# Patient Record
Sex: Female | Born: 1980 | Race: White | Hispanic: No | Marital: Married | Smoking: Former smoker
Health system: Southern US, Community
[De-identification: ages and names within clinical notes are randomized; demographics above are authoritative.]

## PROBLEM LIST (undated history)

## (undated) DIAGNOSIS — N200 Calculus of kidney: Secondary | ICD-10-CM

## (undated) DIAGNOSIS — F32A Depression, unspecified: Secondary | ICD-10-CM

## (undated) DIAGNOSIS — R55 Syncope and collapse: Secondary | ICD-10-CM

## (undated) DIAGNOSIS — G43909 Migraine, unspecified, not intractable, without status migrainosus: Secondary | ICD-10-CM

## (undated) DIAGNOSIS — I10 Essential (primary) hypertension: Secondary | ICD-10-CM

## (undated) HISTORY — PX: TUBAL LIGATION: SHX77

## (undated) HISTORY — DX: Essential (primary) hypertension: I10

## (undated) HISTORY — DX: Syncope and collapse: R55

## (undated) HISTORY — PX: TYMPANOSTOMY TUBE PLACEMENT: SHX32

---

## 2003-07-16 ENCOUNTER — Inpatient Hospital Stay (HOSPITAL_COMMUNITY): Admission: AD | Admit: 2003-07-16 | Discharge: 2003-07-19 | Payer: Self-pay | Admitting: *Deleted

## 2003-07-25 ENCOUNTER — Encounter: Admission: RE | Admit: 2003-07-25 | Discharge: 2003-07-25 | Payer: Self-pay | Admitting: *Deleted

## 2003-08-01 ENCOUNTER — Encounter: Admission: RE | Admit: 2003-08-01 | Discharge: 2003-08-01 | Payer: Self-pay | Admitting: *Deleted

## 2003-08-07 ENCOUNTER — Inpatient Hospital Stay (HOSPITAL_COMMUNITY): Admission: AD | Admit: 2003-08-07 | Discharge: 2003-08-08 | Payer: Self-pay | Admitting: Obstetrics & Gynecology

## 2003-08-15 ENCOUNTER — Encounter: Admission: RE | Admit: 2003-08-15 | Discharge: 2003-08-15 | Payer: Self-pay | Admitting: *Deleted

## 2003-08-22 ENCOUNTER — Encounter: Admission: RE | Admit: 2003-08-22 | Discharge: 2003-08-22 | Payer: Self-pay | Admitting: Family Medicine

## 2003-08-29 ENCOUNTER — Encounter: Admission: RE | Admit: 2003-08-29 | Discharge: 2003-08-29 | Payer: Self-pay | Admitting: Family Medicine

## 2003-09-05 ENCOUNTER — Encounter: Admission: RE | Admit: 2003-09-05 | Discharge: 2003-09-05 | Payer: Self-pay | Admitting: Family Medicine

## 2003-09-07 ENCOUNTER — Observation Stay (HOSPITAL_COMMUNITY): Admission: AD | Admit: 2003-09-07 | Discharge: 2003-09-08 | Payer: Self-pay | Admitting: Obstetrics & Gynecology

## 2003-09-12 ENCOUNTER — Inpatient Hospital Stay (HOSPITAL_COMMUNITY): Admission: AD | Admit: 2003-09-12 | Discharge: 2003-09-12 | Payer: Self-pay | Admitting: Obstetrics & Gynecology

## 2003-09-13 ENCOUNTER — Inpatient Hospital Stay (HOSPITAL_COMMUNITY): Admission: AD | Admit: 2003-09-13 | Discharge: 2003-09-14 | Payer: Self-pay | Admitting: Family Medicine

## 2004-01-23 ENCOUNTER — Emergency Department: Payer: Self-pay | Admitting: Emergency Medicine

## 2004-08-13 ENCOUNTER — Observation Stay: Payer: Self-pay | Admitting: Obstetrics and Gynecology

## 2004-08-17 ENCOUNTER — Ambulatory Visit: Payer: Self-pay | Admitting: Obstetrics and Gynecology

## 2004-10-18 ENCOUNTER — Inpatient Hospital Stay: Payer: Self-pay | Admitting: Obstetrics and Gynecology

## 2006-02-18 ENCOUNTER — Emergency Department: Payer: Self-pay | Admitting: Emergency Medicine

## 2006-04-23 ENCOUNTER — Emergency Department: Payer: Self-pay | Admitting: Emergency Medicine

## 2006-08-29 ENCOUNTER — Emergency Department: Payer: Self-pay | Admitting: Emergency Medicine

## 2007-04-14 ENCOUNTER — Emergency Department: Payer: Self-pay | Admitting: Unknown Physician Specialty

## 2007-08-20 ENCOUNTER — Emergency Department: Payer: Self-pay | Admitting: Emergency Medicine

## 2008-02-10 ENCOUNTER — Emergency Department: Payer: Self-pay | Admitting: Emergency Medicine

## 2008-02-13 ENCOUNTER — Emergency Department (HOSPITAL_COMMUNITY): Admission: EM | Admit: 2008-02-13 | Discharge: 2008-02-13 | Payer: Self-pay | Admitting: Emergency Medicine

## 2008-02-21 ENCOUNTER — Inpatient Hospital Stay (HOSPITAL_COMMUNITY): Admission: AD | Admit: 2008-02-21 | Discharge: 2008-02-21 | Payer: Self-pay | Admitting: Obstetrics & Gynecology

## 2008-03-22 ENCOUNTER — Emergency Department: Payer: Self-pay | Admitting: Emergency Medicine

## 2008-08-26 ENCOUNTER — Ambulatory Visit: Payer: Self-pay | Admitting: Family Medicine

## 2008-11-04 ENCOUNTER — Encounter: Admission: RE | Admit: 2008-11-04 | Discharge: 2008-11-04 | Payer: Self-pay | Admitting: Neurology

## 2010-02-08 ENCOUNTER — Ambulatory Visit: Payer: Self-pay | Admitting: Internal Medicine

## 2010-03-14 ENCOUNTER — Emergency Department: Payer: Self-pay | Admitting: Emergency Medicine

## 2010-08-19 ENCOUNTER — Emergency Department: Payer: Self-pay | Admitting: Internal Medicine

## 2010-09-04 NOTE — Discharge Summary (Signed)
NAME:  Nicole Rowe, Nicole Rowe                         ACCOUNT NO.:  [id number]   MEDICAL RECORD NO.:  [id number]                   PATIENT TYPE:  INP   LOCATION:  9158                                 FACILITY:  [facility]   PHYSICIAN:  Franchot Mimes, MD                   DATE OF BIRTH:  Nov 28, 1980   DATE OF ADMISSION:  07/16/2003  DATE OF DISCHARGE:  07/19/2003                                 DISCHARGE SUMMARY   DISCHARGE DIAGNOSES:  1. Viable intrauterine pregnancy.  2. Threatened preterm labor.  3. Status post betamethasone times two.   DISCHARGE MEDICATIONS:  1. Procardia XL 30 mg p.o. b.i.d.  2. Clindamycin 300 mg p.o. q.i.d. times five days.  3. Prenatal vitamin every day.   HISTORY OF PRESENT ILLNESS:  The patient is a 30 year old, G2, P0-1-0-0, who  presented at 36 weeks 0 days gestational age as a transfer from [facility] for concern of preterm labor. The patient states  that she had continued cramping and vaginal spotting for approximately one  day prior to presentation to [facility]. Cervical check performed at  that hospital revealed the cervix to be fingertip, 50% effaced, with a high  station. On the monitor the patient was contracting approximately every two  minutes for about 30 to 40 seconds and indicated they were mildly painful.  The patient was initially treated with terbutaline at the outside hospital  and then with magnesium sulfate with low results. She was then transferred  to the [facility] [employer] as a precautionary measure in case of  delivery and the need of a level 3 nursery.   HOSPITAL COURSE:   PROBLEM #1:  Threatened preterm labor. On presentation the patient was  started on gentamycin and clindamycin as well as magnesium sulfate at 3 gm  per hour. The day following admission the patient continued to have  contractions approximately every four to five minutes. However, the patient  was no longer feeling these  contractions. The patient had stable blood  pressures and no neurologic symptoms of preeclampsia throughout the entire  hospital course. She was given betamethasone times two 24 hours apart upon  presentation to [facility]. Two days following admission the patient's  magnesium was weaned by 0.5 gm q.2h. until it was discontinued. Once the  magnesium was discontinued the patient was started on Procardia 10 mg p.o.  t.i.d. Tocometer at the time of discharge revealed no contractions for  approximately the last 12 hours prior to discharge.   Ultrasound obtained on admission revealed appropriate growth a normal AFI of  14.8. However, the cervix revealed funneling of the internal os with a  cervical length of 1.2 cm. However, since the patient was no longer  contracting while off magnesium and on Procardia, decision was made that the  patient was okay for discharge home with bedrest, Procardia, and  antibiotics.  DISCHARGE DESTINATION:  The patient was discharged home to bedrest.   FOLLOWUP APPOINTMENT:  The patient was scheduled for follow up in the high  risk clinic on July 25, 2003, at 10:45 a.m.   LABORATORY DATA:  Chlamydia, Gonorrhea, or GBS cultures were performed at an  outside hospital and the results are not available at the time of dictation.  A urine culture was drawn while in the hospital which revealed no growth.   SUGGESTED FOLLOWUP ITEMS:  A follow-up ultrasound and routine cervical  checks recommended in the future to monitor cervical length, cervical  dilatation and effacement.                                               Franchot Mimes, MD    TV/MEDQ  D:  08/11/2003  T:  08/11/2003  Job:  [id number]

## 2011-01-19 LAB — WET PREP, GENITAL
Trich, Wet Prep: NONE SEEN
Yeast Wet Prep HPF POC: NONE SEEN

## 2011-01-19 LAB — POCT URINALYSIS DIP (DEVICE)
Glucose, UA: NEGATIVE
Hgb urine dipstick: NEGATIVE
Nitrite: NEGATIVE
Protein, ur: NEGATIVE
Specific Gravity, Urine: 1.015
Urobilinogen, UA: 1
pH: 7

## 2011-01-19 LAB — CBC
HCT: 40.7
MCHC: 32.6
MCV: 86.7
Platelets: 315
WBC: 8.2

## 2011-01-19 LAB — HCG, QUANTITATIVE, PREGNANCY: hCG, Beta Chain, Quant, S: <2

## 2011-03-02 ENCOUNTER — Emergency Department: Payer: Self-pay | Admitting: Emergency Medicine

## 2011-05-12 ENCOUNTER — Ambulatory Visit: Payer: Self-pay | Admitting: Specialist

## 2011-08-05 ENCOUNTER — Emergency Department: Payer: Self-pay | Admitting: Emergency Medicine

## 2011-09-12 ENCOUNTER — Emergency Department: Payer: Self-pay | Admitting: Emergency Medicine

## 2011-10-04 ENCOUNTER — Ambulatory Visit: Payer: Self-pay | Admitting: Emergency Medicine

## 2012-02-05 ENCOUNTER — Emergency Department: Payer: Self-pay | Admitting: Emergency Medicine

## 2012-02-21 ENCOUNTER — Emergency Department: Payer: Self-pay | Admitting: Emergency Medicine

## 2013-02-07 ENCOUNTER — Emergency Department: Payer: Self-pay | Admitting: Emergency Medicine

## 2013-03-11 ENCOUNTER — Emergency Department: Payer: Self-pay | Admitting: Emergency Medicine

## 2014-01-03 ENCOUNTER — Emergency Department: Payer: Self-pay | Admitting: Emergency Medicine

## 2014-01-03 LAB — TROPONIN I

## 2014-01-03 LAB — BASIC METABOLIC PANEL
Anion Gap: 8 (ref 7–16)
BUN: 12 mg/dL (ref 7–18)
Calcium, Total: 8.8 mg/dL (ref 8.5–10.1)
Chloride: 110 mmol/L — ABNORMAL HIGH (ref 98–107)
Co2: 24 mmol/L (ref 21–32)
Creatinine: 0.63 mg/dL (ref 0.60–1.30)
EGFR (African American): >60
GLUCOSE: 101 mg/dL — AB (ref 65–99)
OSMOLALITY: 283 (ref 275–301)
POTASSIUM: 3.7 mmol/L (ref 3.5–5.1)
Sodium: 142 mmol/L (ref 136–145)

## 2014-01-03 LAB — CBC
HCT: 39.2 % (ref 35.0–47.0)
HGB: 12.6 g/dL (ref 12.0–16.0)
MCH: 27.7 pg (ref 26.0–34.0)
MCHC: 32 g/dL (ref 32.0–36.0)
MCV: 86 fL (ref 80–100)
PLATELETS: 268 10*3/uL (ref 150–440)
RBC: 4.54 10*6/uL (ref 3.80–5.20)
RDW: 13.8 % (ref 11.5–14.5)
WBC: 9.9 10*3/uL (ref 3.6–11.0)

## 2014-01-03 LAB — PRO B NATRIURETIC PEPTIDE: B-Type Natriuretic Peptide: 30 pg/mL (ref 0–125)

## 2014-01-08 ENCOUNTER — Encounter: Payer: Self-pay | Admitting: Cardiovascular Disease

## 2014-01-08 ENCOUNTER — Ambulatory Visit (INDEPENDENT_AMBULATORY_CARE_PROVIDER_SITE_OTHER): Payer: Medicaid Other | Admitting: Cardiovascular Disease

## 2014-01-08 VITALS — BP 133/92 | HR 69 | Ht 63.0 in | Wt 192.5 lb

## 2014-01-08 DIAGNOSIS — R079 Chest pain, unspecified: Secondary | ICD-10-CM

## 2014-01-08 DIAGNOSIS — R0602 Shortness of breath: Secondary | ICD-10-CM

## 2014-01-08 NOTE — Patient Instructions (Signed)
Your physician has requested that you have an exercise tolerance test. For further information please visit [email or web address] Please also follow instruction sheet, as given. - Hold Atenolol the morning of your exercise tolerance test  - Eat a light meal  - Wear comfortable cloths and walking shoes    Your physician has requested that you have an echocardiogram. Echocardiography is a painless test that uses sound waves to create images of your heart. It provides your doctor with information about the size and shape of your heart and how well your heart's chambers and valves are working. This procedure takes approximately one hour. There are no restrictions for this procedure.   Your physician recommends that you schedule a follow-up appointment in:  As needed

## 2014-01-11 ENCOUNTER — Encounter: Payer: Self-pay | Admitting: Cardiovascular Disease

## 2014-01-11 DIAGNOSIS — R079 Chest pain, unspecified: Secondary | ICD-10-CM | POA: Insufficient documentation

## 2014-01-11 NOTE — Assessment & Plan Note (Signed)
The chest pain is overall atypical but she does have risk factors for coronary artery disease. Thus, I requested a treadmill stress test for evaluation. I discussed with the patient the importance of lifestyle changes in order to decrease the chance of future coronary artery disease and cardiovascular events. We discussed the importance of controlling risk factors, healthy diet as well as regular exercise. I also explained to him that a normal stress test does not rule out atherosclerosis.

## 2014-01-11 NOTE — Progress Notes (Signed)
   HPI  This is a 33 year old female who was referred from the emergency room at [facility] for evaluation of chest pain. She has known history of hypertension and migraines. She has no history of diabetes, hyperlipidemia or tobacco use. She does have family history of premature coronary artery disease. She had intermittent substernal sharp chest pain since last week lasting for a few minutes with mild shortness of breath. This has been mostly nonexertional. She endorses exertional dyspnea with no orthopnea or PND. Workup in the emergency room showed normal EKG. Left foot unremarkable including negative cardiac enzymes. BNP was normal.  Allergies  Allergen Reactions  . Penicillins     BRUISING AND SWELLING     No current outpatient prescriptions on file prior to visit.   No current facility-administered medications on file prior to visit.     Past Medical History  Diagnosis Date  . Syncope and collapse   . Hypertension      Past Surgical History  Procedure Laterality Date  . Tympanostomy tube placement    . Tubal ligation       Family History  Problem Relation Age of Onset  . Arrhythmia Mother   . Hyperlipidemia Mother   . Heart attack Father   . Stroke Father   . Hypertension Father   . Hyperlipidemia Father      History   Social History  . Marital Status: Single    Spouse Name: N/A    Number of Children: N/A  . Years of Education: N/A   Occupational History  . Not on file.   Social History Main Topics  . Smoking status: Never Smoker   . Smokeless tobacco: Not on file  . Alcohol Use: No  . Drug Use: No  . Sexual Activity: Not on file   Other Topics Concern  . Not on file   Social History Narrative  . No narrative on file     ROS A 10 point review of system was performed. It is negative other than that mentioned in the history of present illness.   PHYSICAL EXAM   BP 133/92  Pulse 69  Ht  (1.6 m)  Wt 192 lb 8 oz (87.317 kg)  BMI 34.11  kg/m2 Constitutional: She is oriented to person, place, and time. She appears well-developed and well-nourished. No distress.  HENT: No nasal discharge.  Head: Normocephalic and atraumatic.  Eyes: Pupils are equal and round. No discharge.  Neck: Normal range of motion. Neck supple. No JVD present. No thyromegaly present.  Cardiovascular: Normal rate, regular rhythm, normal heart sounds. Exam reveals no gallop and no friction rub. No murmur heard.  Pulmonary/Chest: Effort normal and breath sounds normal. No stridor. No respiratory distress. She has no wheezes. She has no rales. She exhibits no tenderness.  Abdominal: Soft. Bowel sounds are normal. She exhibits no distension. There is no tenderness. There is no rebound and no guarding.  Musculoskeletal: Normal range of motion. She exhibits no edema and no tenderness.  Neurological: She is alert and oriented to person, place, and time. Coordination normal.  Skin: Skin is warm and dry. No rash noted. She is not diaphoretic. No erythema. No pallor.  Psychiatric: She has a normal mood and affect. Her behavior is normal. Judgment and thought content normal.     ZOX:WRUEA  Rhythm  WITHIN NORMAL LIMITS   ASSESSMENT AND PLAN

## 2014-01-18 ENCOUNTER — Other Ambulatory Visit (INDEPENDENT_AMBULATORY_CARE_PROVIDER_SITE_OTHER): Payer: Medicaid Other

## 2014-01-18 ENCOUNTER — Ambulatory Visit (INDEPENDENT_AMBULATORY_CARE_PROVIDER_SITE_OTHER): Payer: Medicaid Other | Admitting: Cardiovascular Disease

## 2014-01-18 ENCOUNTER — Other Ambulatory Visit: Payer: Self-pay

## 2014-01-18 ENCOUNTER — Encounter: Payer: Medicaid Other | Admitting: Cardiovascular Disease

## 2014-01-18 VITALS — BP 111/74 | HR 73 | Ht 63.0 in | Wt 192.0 lb

## 2014-01-18 DIAGNOSIS — R0602 Shortness of breath: Secondary | ICD-10-CM

## 2014-01-18 DIAGNOSIS — R079 Chest pain, unspecified: Secondary | ICD-10-CM

## 2014-01-18 NOTE — Patient Instructions (Addendum)
You had  normal stress test today.  Target heart rate achieved Good exercise tolerance No EKG changes concerning for ischemia Please call if you have any further questions

## 2014-01-18 NOTE — Procedures (Signed)
Exercise Treadmill Test  Treadmill ordered for recent epsiodes of chest pain.  Resting EKG shows NSR with rate of 73 bpm, no specific ST or T wave changes Resting blood pressure of 111/74. Stand bruce protocal was used.  Patient exercised for 9 min 00 sec,  Peak heart rate of 196 bpm.  This was 89% of the maximum predicted heart rate (188 bpm). Achieved 10.1 METS No symptoms of chest pain or lightheadedness were reported at peak stress or in recovery.  Peak Blood pressure recorded was 139/82. Heart rate at 3 minutes in recovery was 88 bpm.  FINAL IMPRESSION: Normal exercise stress test. No significant EKG changes concerning for ischemia. Excellent exercise tolerance.

## 2014-01-21 ENCOUNTER — Other Ambulatory Visit: Payer: Self-pay | Admitting: *Deleted

## 2014-01-21 NOTE — Progress Notes (Signed)
   Patient ID: Nicole Rowe, female    DOB: 1980/11/04, 33 y.o.   MRN: [id number]  HPI Comments: Treadmill stress test ordered by Dr. Kirke CorinArida for further evaluation of chest pain     Review of Systems    Physical Exam

## 2014-01-21 NOTE — Assessment & Plan Note (Signed)
Treadmill stress test ordered today for evaluation of chest pain

## 2014-01-24 ENCOUNTER — Ambulatory Visit: Payer: Self-pay | Admitting: Primary Care

## 2014-05-31 ENCOUNTER — Emergency Department: Payer: Self-pay | Admitting: Emergency Medicine

## 2014-07-12 ENCOUNTER — Ambulatory Visit: Payer: Self-pay | Admitting: Specialist

## 2014-07-16 ENCOUNTER — Emergency Department
Admit: 2014-07-16 | Disposition: A | Discharge status: Home / Self Care | Payer: Self-pay | Admitting: Emergency Medicine

## 2014-10-03 ENCOUNTER — Emergency Department: Payer: Medicaid Other

## 2014-10-03 ENCOUNTER — Encounter: Payer: Self-pay | Admitting: Emergency Medicine

## 2014-10-03 ENCOUNTER — Emergency Department
Admission: EM | Admit: 2014-10-03 | Discharge: 2014-10-03 | Disposition: A | Discharge status: Home / Self Care | Payer: Medicaid Other | Attending: Emergency Medicine | Admitting: Emergency Medicine

## 2014-10-03 DIAGNOSIS — X58XXXA Exposure to other specified factors, initial encounter: Secondary | ICD-10-CM | POA: Insufficient documentation

## 2014-10-03 DIAGNOSIS — Y9289 Other specified places as the place of occurrence of the external cause: Secondary | ICD-10-CM | POA: Insufficient documentation

## 2014-10-03 DIAGNOSIS — Y998 Other external cause status: Secondary | ICD-10-CM | POA: Insufficient documentation

## 2014-10-03 DIAGNOSIS — Y9301 Activity, walking, marching and hiking: Secondary | ICD-10-CM | POA: Insufficient documentation

## 2014-10-03 DIAGNOSIS — S93402A Sprain of unspecified ligament of left ankle, initial encounter: Secondary | ICD-10-CM | POA: Insufficient documentation

## 2014-10-03 DIAGNOSIS — I10 Essential (primary) hypertension: Secondary | ICD-10-CM | POA: Insufficient documentation

## 2014-10-03 MED ORDER — TRAMADOL HCL 50 MG PO TABS: 50.0000 mg | ORAL_TABLET | Freq: Two times a day (BID) | ORAL | Status: DC

## 2014-10-03 MED ORDER — KETOROLAC TROMETHAMINE 10 MG PO TABS: 10.0000 mg | ORAL_TABLET | Freq: Three times a day (TID) | ORAL | Status: DC

## 2014-10-03 NOTE — Discharge Instructions (Signed)
Ankle Sprain °An ankle sprain is an injury to the strong, fibrous tissues (ligaments) that hold the bones of your ankle joint together.  °CAUSES °An ankle sprain is usually caused by a fall or by twisting your ankle. Ankle sprains most commonly occur when you step on the outer edge of your foot, and your ankle turns inward. People who participate in sports are more prone to these types of injuries.  °SYMPTOMS  °· Pain in your ankle. The pain may be present at rest or only when you are trying to stand or walk. °· Swelling. °· Bruising. Bruising may develop immediately or within 1 to 2 days after your injury. °· Difficulty standing or walking, particularly when turning corners or changing directions. °DIAGNOSIS  °Your caregiver will ask you details about your injury and perform a physical exam of your ankle to determine if you have an ankle sprain. During the physical exam, your caregiver will press on and apply pressure to specific areas of your foot and ankle. Your caregiver will try to move your ankle in certain ways. An X-ray exam may be done to be sure a bone was not broken or a ligament did not separate from one of the bones in your ankle (avulsion fracture).  °TREATMENT  °Certain types of braces can help stabilize your ankle. Your caregiver can make a recommendation for this. Your caregiver may recommend the use of medicine for pain. If your sprain is severe, your caregiver may refer you to a surgeon who helps to restore function to parts of your skeletal system (orthopedist) or a physical therapist. °HOME CARE INSTRUCTIONS  °· Apply ice to your injury for 1-2 days or as directed by your caregiver. Applying ice helps to reduce inflammation and pain. °¨ Put ice in a plastic bag. °¨ Place a towel between your skin and the bag. °¨ Leave the ice on for 15-20 minutes at a time, every 2 hours while you are awake. °· Only take over-the-counter or prescription medicines for pain, discomfort, or fever as directed by  your caregiver. °· Elevate your injured ankle above the level of your heart as much as possible for 2-3 days. °· If your caregiver recommends crutches, use them as instructed. Gradually put weight on the affected ankle. Continue to use crutches or a cane until you can walk without feeling pain in your ankle. °· If you have a plaster splint, wear the splint as directed by your caregiver. Do not rest it on anything harder than a pillow for the first 24 hours. Do not put weight on it. Do not get it wet. You may take it off to take a shower or bath. °· You may have been given an elastic bandage to wear around your ankle to provide support. If the elastic bandage is too tight (you have numbness or tingling in your foot or your foot becomes cold and blue), adjust the bandage to make it comfortable. °· If you have an air splint, you may blow more air into it or let air out to make it more comfortable. You may take your splint off at night and before taking a shower or bath. Wiggle your toes in the splint several times per day to decrease swelling. °SEEK MEDICAL CARE IF:  °· You have rapidly increasing bruising or swelling. °· Your toes feel extremely cold or you lose feeling in your foot. °· Your pain is not relieved with medicine. °SEEK IMMEDIATE MEDICAL CARE IF: °· Your toes are numb or blue. °·   You have severe pain that is increasing. MAKE SURE YOU:   Understand these instructions.  Will watch your condition.  Will get help right away if you are not doing well or get worse. Document Released: 04/05/2005 Document Revised: 12/29/2011 Document Reviewed: 04/17/2011 [facility] Patient Information 2015 [place], [place]. This information is not intended to replace advice given to you by your health care provider. Make sure you discuss any questions you have with your health care provider.  Wear the stirrup splint as needed for support.  Apply ice to reduce swelling.  Follow-up with your provider or Dr. Ernest Pine for  ongoing care & management.

## 2014-10-03 NOTE — ED Notes (Signed)
Pt states that she was walking down some steps, thought that she was at the last step and rolled her left ankle. Swelling is seen at this time. Pt is able to ambulate and bear weight at this time.

## 2014-10-03 NOTE — ED Provider Notes (Signed)
[facility] Emergency Department Provider Note ____________________________________________  Time seen: 1302  I have reviewed the triage vital signs and the nursing notes.  HISTORY  Chief Complaint  Ankle Pain  HPI Nicole Rowe is a 34 y.o. female reports to the ED with left ankle pain after she twisted it while walking down some steps yesterday.She notes some swelling to the lateral ankle at this time. She denies ankle catch, click, lock, or giveway.   Past Medical History  Diagnosis Date  . Syncope and collapse   . Hypertension     Patient Active Problem List   Diagnosis Date Noted  . Pain in the chest 01/11/2014    Past Surgical History  Procedure Laterality Date  . Tympanostomy tube placement    . Tubal ligation      Current Outpatient Rx  Name  Route  Sig  Dispense  Refill  . atenolol-chlorthalidone (TENORETIC) 100-25 MG per tablet   Oral   Take 1 tablet by mouth as needed.         Marland Kitchen ketorolac (TORADOL) 10 MG tablet   Oral   Take 1 tablet (10 mg total) by mouth every 8 (eight) hours.   15 tablet   0   . promethazine (PHENERGAN) 25 MG tablet   Oral   Take 25 mg by mouth daily.         . QUEtiapine (SEROQUEL) 25 MG tablet   Oral   Take 25 mg by mouth at bedtime.         Marland Kitchen tiZANidine (ZANAFLEX) 2 MG tablet   Oral   Take by mouth 3 (three) times daily as needed for muscle spasms.         . traMADol (ULTRAM) 50 MG tablet   Oral   Take 1 tablet (50 mg total) by mouth 2 (two) times daily.   10 tablet   0    Allergies Penicillins  Family History  Problem Relation Age of Onset  . Arrhythmia Mother   . Hyperlipidemia Mother   . Heart attack Father   . Stroke Father   . Hypertension Father   . Hyperlipidemia Father     Social History History  Substance Use Topics  . Smoking status: Never Smoker   . Smokeless tobacco: Not on file  . Alcohol Use: No   Review of Systems  Constitutional: Negative for  fever. Eyes: Negative for visual changes. ENT: Negative for sore throat. Cardiovascular: Negative for chest pain. Respiratory: Negative for shortness of breath. Gastrointestinal: Negative for abdominal pain, vomiting and diarrhea. Genitourinary: Negative for dysuria. Musculoskeletal: Negative for back pain. Reports left ankle pain  Skin: Negative for rash. Neurological: Negative for headaches, focal weakness or numbness. ____________________________________________  PHYSICAL EXAM:  VITAL SIGNS: ED Triage Vitals  Enc Vitals Group     BP 10/03/14 1226 136/100 mmHg     Pulse Rate 10/03/14 1226 77     Resp 10/03/14 1226 16     Temp 10/03/14 1226 98.8 F (37.1 C)     Temp Source 10/03/14 1226 Oral     SpO2 10/03/14 1226 96 %     Weight 10/03/14 1226 220 lb (99.791 kg)     Height 10/03/14 1226 5\' 3"  (1.6 m)     Head Cir --      Peak Flow --      Pain Score 10/03/14 1232 5     Pain Loc --      Pain Edu? --  Excl. in GC? --    Constitutional: Alert and oriented. Well appearing and in no distress. HEENT: Normocephalic and atraumatic. Conjunctivae are normal. PERRL. Normal extraocular movements. Cardiovascular: Normal distal pulses LLE.  Respiratory: Normal respiratory effort.  Musculoskeletal: Left ankle with lateral swelling at the malleolus. No bruising or ecchymosis noted. Minimally tender to palp laterally.  Normal ROM without laxity. Negative drawer. No calf or achilles tenderness.  Neurologic:  Normal gait without ataxia. Normal speech and language. No gross focal neurologic deficits are appreciated. Skin:  Skin is warm, dry and intact. No rash noted. Psychiatric: Mood and affect are normal. Patient exhibits appropriate insight and judgment. __________________________________   RADIOLOGY  Left Ankle IMPRESSION: Swelling laterally with small joint effusion. These findings could indicate underlying ligamentous injury. No fracture. Mortise intact. Small inferior  calcaneal spur. ____________________________________________  PROCEDURES  Ace bandage and stirrup splint applied to the left ankle.  ____________________________________________  INITIAL IMPRESSION / ASSESSMENT AND PLAN / ED COURSE  Left grade II ankle sprain. ____________________________________________  FINAL CLINICAL IMPRESSION(S) / ED DIAGNOSES  Final diagnoses:  Ankle sprain, left, initial encounter     Lissa Hoard, PA-C 10/03/14 1552  Sharyn Creamer, MD 10/04/14 [phone]

## 2014-10-03 NOTE — ED Notes (Signed)
Pt verbalized understanding of instructions and prescriptions as well as follow up

## 2014-10-26 IMAGING — CT CT HEAD WITHOUT CONTRAST
2 series · 16 of 30 positions shown, 20 images · non-contrast
Comparison: none

REASON FOR EXAM: headache x 3 weeks eval
COMMENTS:

PROCEDURE:     CT  - CT HEAD WITHOUT CONTRAST  - February 22, 2012  [DATE]
RESULT:     Comparison:  None
TECHNIQUE: Multiple axial images from the foramen magnum to the vertex were
obtained without IV contrast.

[Series 2: without · axial · non-contrast · 0.45mm/px · z∈[-160,-40]mm · 13 of 30 slices shown, 17 images]
[im 3/30  brain]
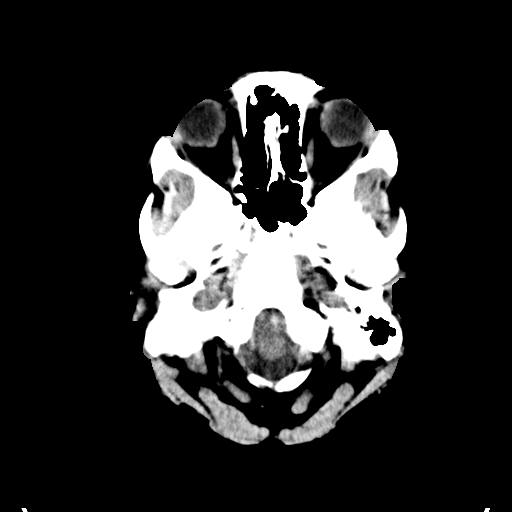
[im 3/30  bone]
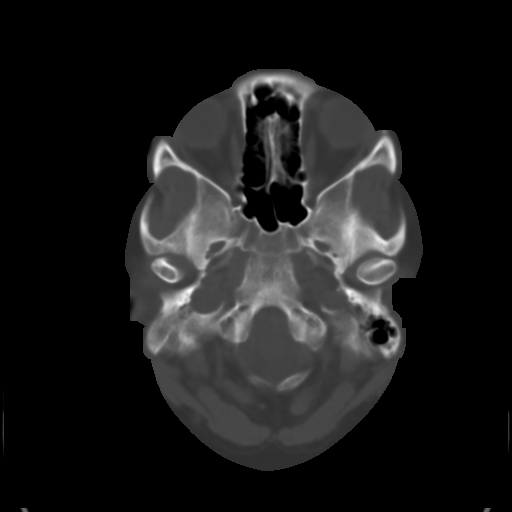
[im 5/30  brain]
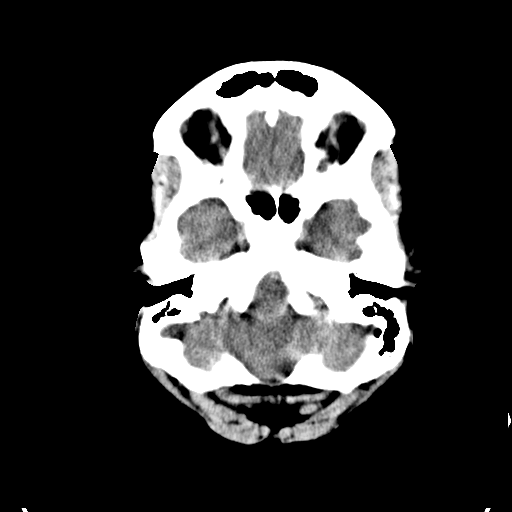
[im 7/30  brain]
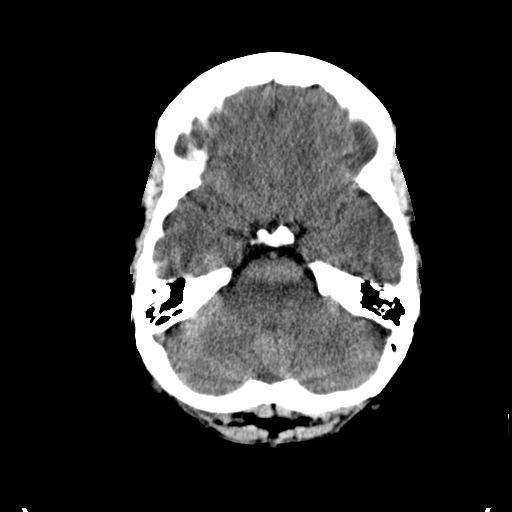
[im 9/30  brain]
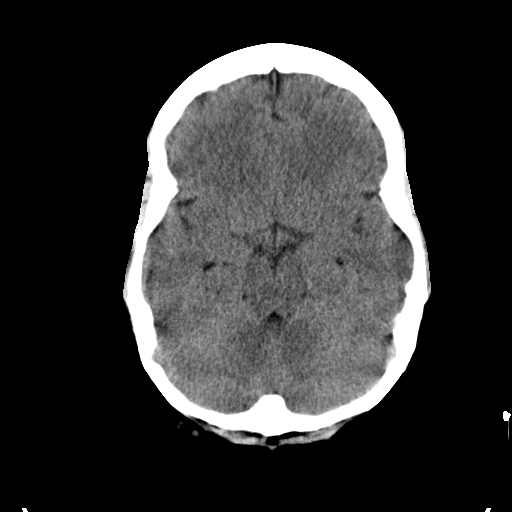
[im 11/30  brain]
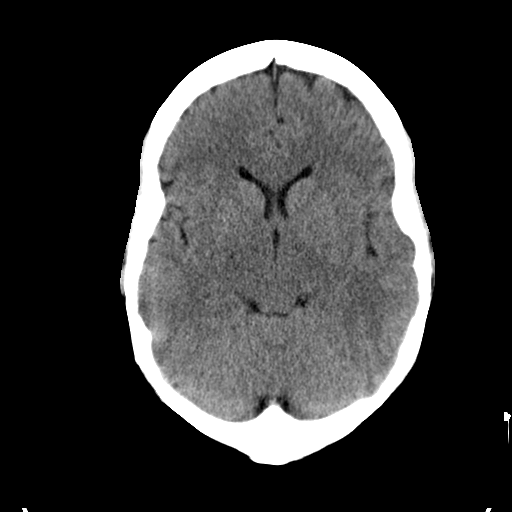
[im 11/30  bone]
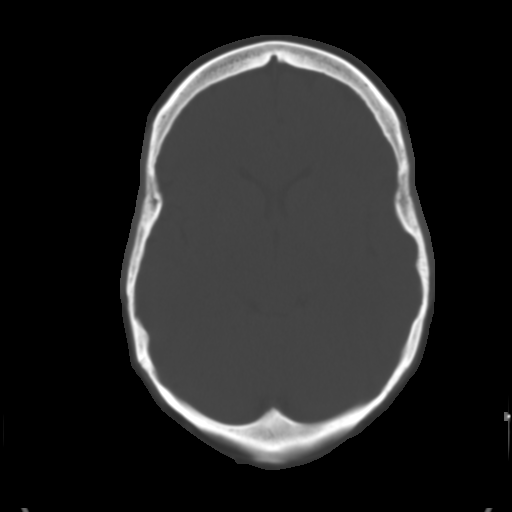
[im 13/30  brain]
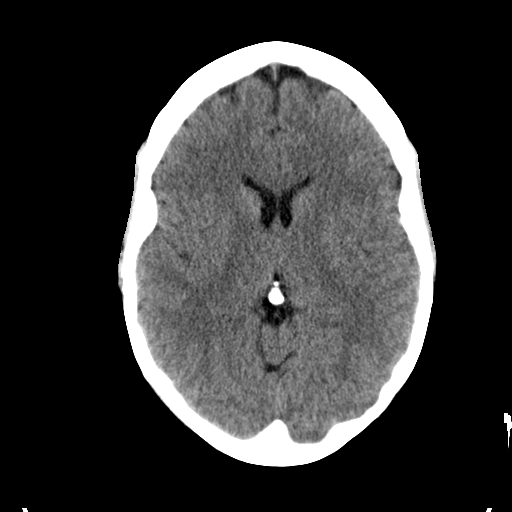
[im 15/30  brain]
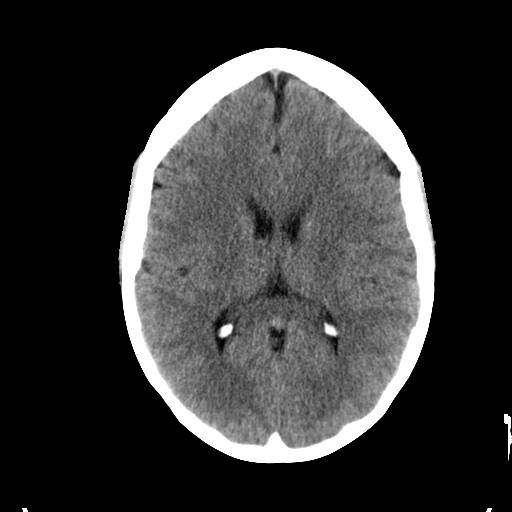
[im 17/30  brain]
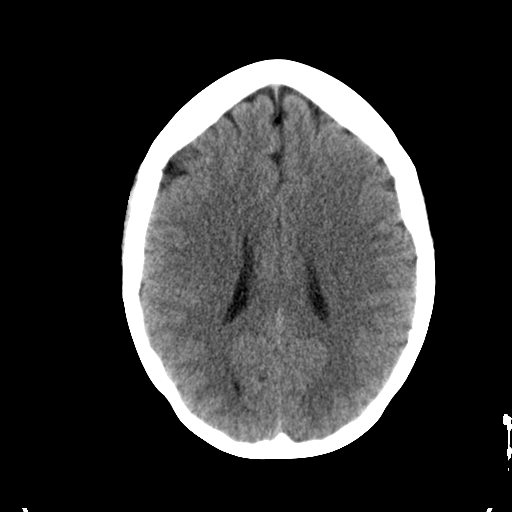
[im 19/30  brain]
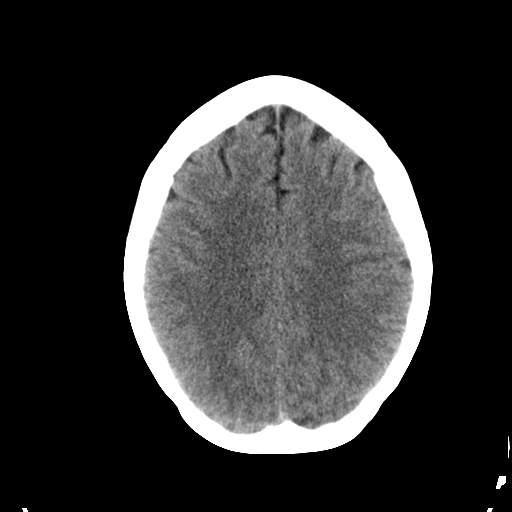
[im 19/30  bone]
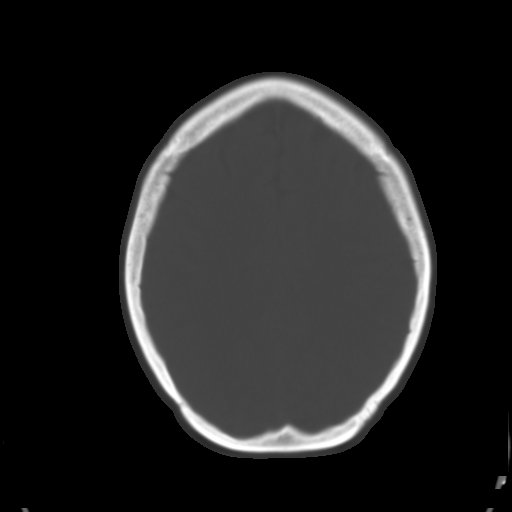
[im 21/30  brain]
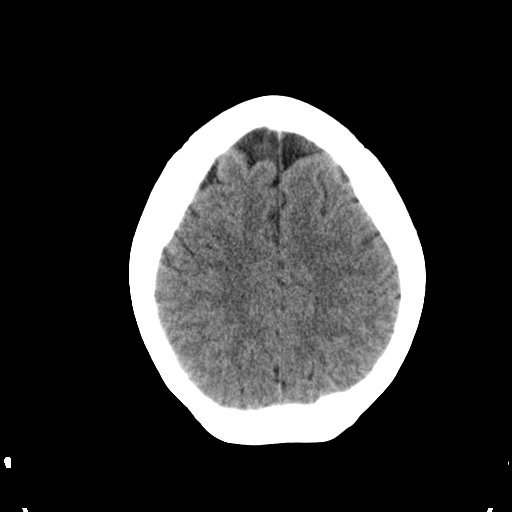
[im 23/30  brain]
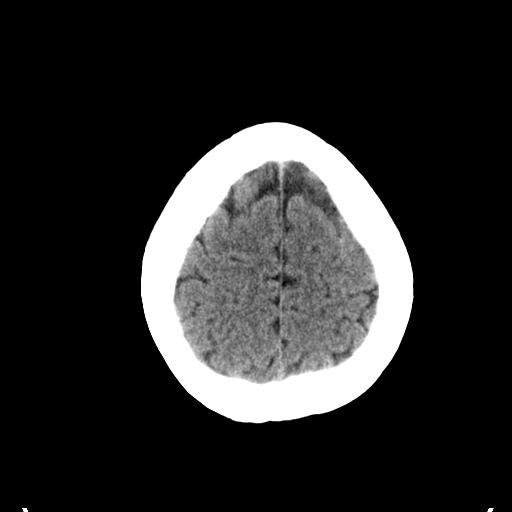
[im 25/30  brain]
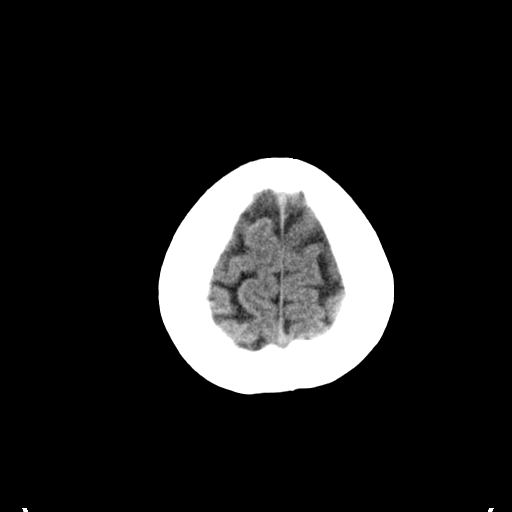
[im 27/30  brain]
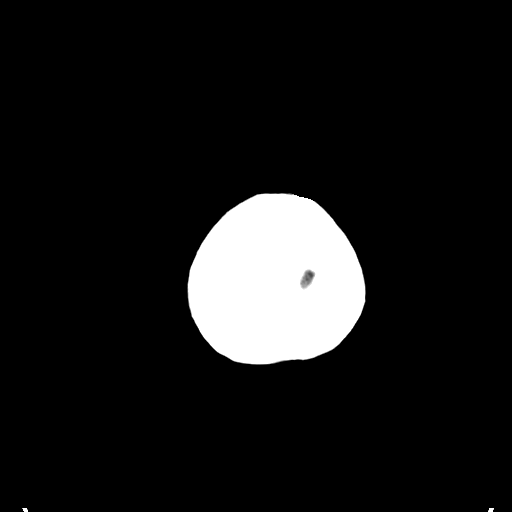
[im 27/30  bone]
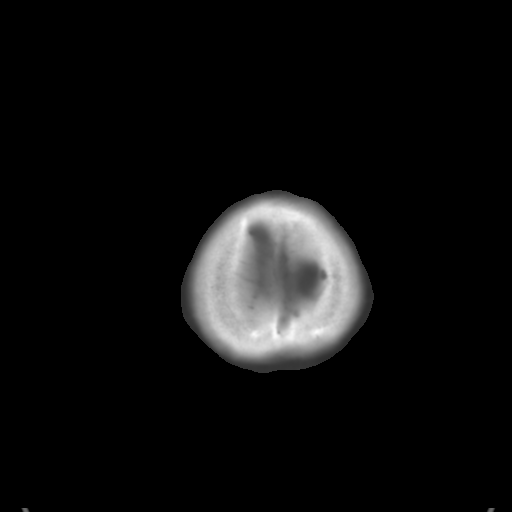

[Series 3: bone · axial · 0.45mm/px · z∈[-160,-120]mm · 3 of 30 slices shown]
[im 3/30  bone]
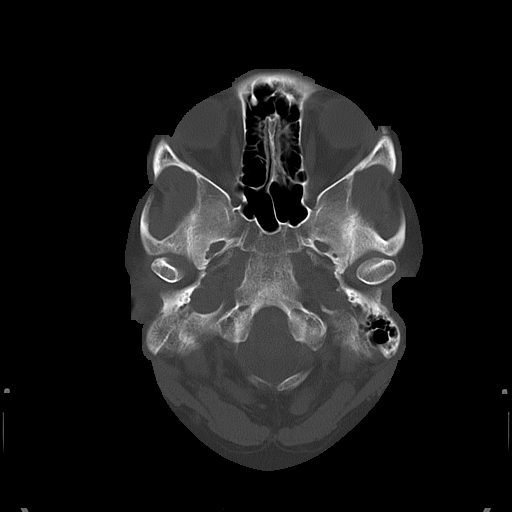
[im 7/30  bone]
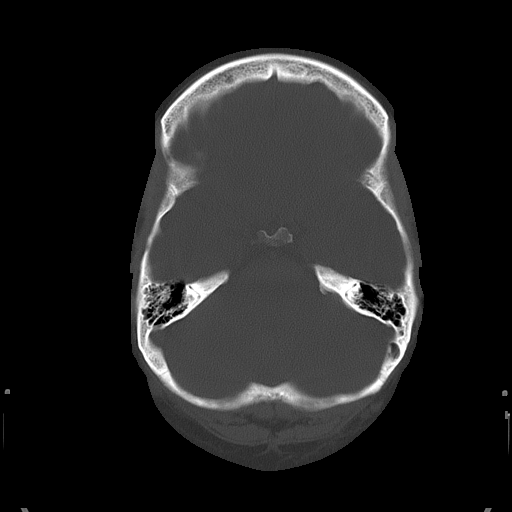
[im 11/30  bone]
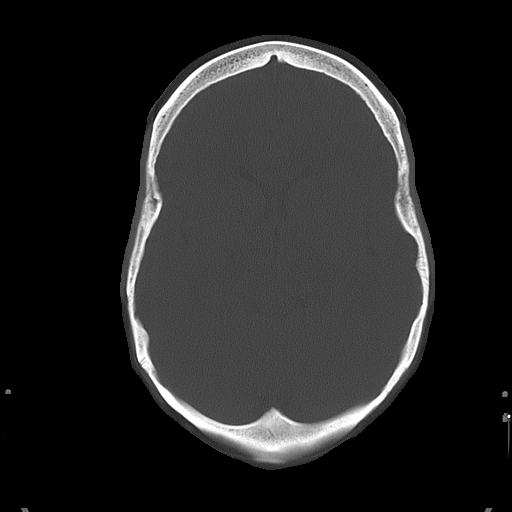

[16 of 30 positions shown; findings below may reference images not displayed]

FINDINGS: There is no evidence of mass effect, midline shift, or extra-axial fluid
collections.  There is no evidence of a space-occupying lesion or
intracranial hemorrhage. There is no evidence of a cortical-based area of
acute infarction.

The ventricles and sulci are appropriate for the patient's age. The basal
cisterns are patent.

Visualized portions of the orbits are unremarkable. The visualized portions
of the paranasal sinuses and mastoid air cells are unremarkable.

The osseous structures are unremarkable.
IMPRESSION: No acute intracranial process.

[REDACTED]

## 2014-11-13 ENCOUNTER — Other Ambulatory Visit: Payer: Self-pay

## 2014-11-13 ENCOUNTER — Emergency Department
Admission: EM | Admit: 2014-11-13 | Discharge: 2014-11-14 | Disposition: A | Discharge status: Home / Self Care | Payer: Self-pay | Attending: Emergency Medicine | Admitting: Emergency Medicine

## 2014-11-13 ENCOUNTER — Encounter: Payer: Self-pay | Admitting: Urgent Care

## 2014-11-13 ENCOUNTER — Emergency Department: Payer: Medicaid Other

## 2014-11-13 DIAGNOSIS — Z880 Allergy status to penicillin: Secondary | ICD-10-CM | POA: Insufficient documentation

## 2014-11-13 DIAGNOSIS — M940 Chondrocostal junction syndrome [Tietze]: Secondary | ICD-10-CM | POA: Insufficient documentation

## 2014-11-13 DIAGNOSIS — Z79899 Other long term (current) drug therapy: Secondary | ICD-10-CM | POA: Insufficient documentation

## 2014-11-13 DIAGNOSIS — I10 Essential (primary) hypertension: Secondary | ICD-10-CM | POA: Insufficient documentation

## 2014-11-13 HISTORY — DX: Migraine, unspecified, not intractable, without status migrainosus: G43.909

## 2014-11-13 LAB — BASIC METABOLIC PANEL
Anion gap: 4 — ABNORMAL LOW (ref 5–15)
BUN: 14 mg/dL (ref 6–20)
CO2: 29 mmol/L (ref 22–32)
CREATININE: 0.53 mg/dL (ref 0.44–1.00)
Calcium: 9 mg/dL (ref 8.9–10.3)
Chloride: 108 mmol/L (ref 101–111)
Glucose, Bld: 105 mg/dL — ABNORMAL HIGH (ref 65–99)
Potassium: 3.1 mmol/L — ABNORMAL LOW (ref 3.5–5.1)
Sodium: 141 mmol/L (ref 135–145)

## 2014-11-13 LAB — TROPONIN I: Troponin I: <0.03 ng/mL (ref <0.031)

## 2014-11-13 LAB — CBC
HCT: 37.7 % (ref 35.0–47.0)
HEMOGLOBIN: 12.3 g/dL (ref 12.0–16.0)
MCH: 27.8 pg (ref 26.0–34.0)
MCHC: 32.8 g/dL (ref 32.0–36.0)
MCV: 84.9 fL (ref 80.0–100.0)
PLATELETS: 264 10*3/uL (ref 150–440)
RBC: 4.44 MIL/uL (ref 3.80–5.20)
RDW: 13.6 % (ref 11.5–14.5)
WBC: 11.4 10*3/uL — ABNORMAL HIGH (ref 3.6–11.0)

## 2014-11-13 NOTE — ED Notes (Signed)
Patient presents with c/o retrosternal CP that began approx 4 hours PTA. (+) slight SOB and headache reported. Symptoms started while at rest.

## 2014-11-13 NOTE — ED Notes (Signed)
Dr. Manson Passey presents to bedside for patient assessment at this time.

## 2014-11-13 NOTE — ED Provider Notes (Signed)
[facility] Emergency Department Provider Note  ____________________________________________  Time seen: 11:15PM  I have reviewed the triage vital signs and the nursing notes.   HISTORY  Chief Complaint Chest Pain and Headache      HPI Nicole Rowe is a 34 y.o. female presents with central chest pain that is persistent 3-1/2 hours. Patient states current pain score is 5 out of 10. Patient also admits to dyspnea and headache. Patient admits to a family history of cardiac disease namely her father had an MI at the age of 35. Patient denies any tobacco use. Admits to history of bipolar depression and migraines.     Past Medical History  Diagnosis Date  . Syncope and collapse   . Hypertension   . Migraine     Patient Active Problem List   Diagnosis Date Noted  . Pain in the chest 01/11/2014    Past Surgical History  Procedure Laterality Date  . Tympanostomy tube placement    . Tubal ligation      Current Outpatient Rx  Name  Route  Sig  Dispense  Refill  . atenolol-chlorthalidone (TENORETIC) 100-25 MG per tablet   Oral   Take 1 tablet by mouth as needed.         Marland Kitchen ketorolac (TORADOL) 10 MG tablet   Oral   Take 1 tablet (10 mg total) by mouth every 8 (eight) hours.   15 tablet   0   . promethazine (PHENERGAN) 25 MG tablet   Oral   Take 25 mg by mouth daily.         . QUEtiapine (SEROQUEL) 25 MG tablet   Oral   Take 25 mg by mouth at bedtime.         Marland Kitchen tiZANidine (ZANAFLEX) 2 MG tablet   Oral   Take by mouth 3 (three) times daily as needed for muscle spasms.         . traMADol (ULTRAM) 50 MG tablet   Oral   Take 1 tablet (50 mg total) by mouth 2 (two) times daily.   10 tablet   0     Allergies Penicillins  Family History  Problem Relation Age of Onset  . Arrhythmia Mother   . Hyperlipidemia Mother   . Heart attack Father   . Stroke Father   . Hypertension Father   . Hyperlipidemia Father     Social  History History  Substance Use Topics  . Smoking status: Never Smoker   . Smokeless tobacco: Not on file  . Alcohol Use: No    Review of Systems  Constitutional: Negative for fever. Eyes: Negative for visual changes. ENT: Negative for sore throat. Cardiovascular: Negative for chest pain. Respiratory: Negative for shortness of breath. Gastrointestinal: Negative for abdominal pain, vomiting and diarrhea. Genitourinary: Negative for dysuria. Musculoskeletal: Negative for back pain. Skin: Negative for rash. Neurological: Negative for headaches, focal weakness or numbness.   10-point ROS otherwise negative.  ____________________________________________   PHYSICAL EXAM:  VITAL SIGNS: ED Triage Vitals  Enc Vitals Group     BP 11/13/14 2220 136/85 mmHg     Pulse Rate 11/13/14 2220 72     Resp 11/13/14 2220 18     Temp 11/13/14 2220 98.5 F (36.9 C)     Temp Source 11/13/14 2220 Oral     SpO2 11/13/14 2220 97 %     Weight 11/13/14 2220 189 lb (85.73 kg)     Height 11/13/14 2220 5\' 3"  (1.6 m)  Head Cir --      Peak Flow --      Pain Score 11/13/14 2220 5     Pain Loc --      Pain Edu? --      Excl. in GC? --      Constitutional: Alert and oriented. Well appearing and in no distress. Eyes: Conjunctivae are normal. PERRL. Normal extraocular movements. ENT   Head: Normocephalic and atraumatic.   Nose: No congestion/rhinnorhea.   Mouth/Throat: Mucous membranes are moist.   Neck: No stridor. Cardiovascular: Normal rate, regular rhythm. Normal and symmetric distal pulses are present in all extremities. No murmurs, rubs, or gallops. Pain with gentle parasternal palpation Respiratory: Normal respiratory effort without tachypnea nor retractions. Breath sounds are clear and equal bilaterally. No wheezes/rales/rhonchi. Gastrointestinal: Soft and nontender. No distention. There is no CVA tenderness. Genitourinary: deferred Musculoskeletal: Nontender with normal  range of motion in all extremities. No joint effusions.  No lower extremity tenderness nor edema. Neurologic:  Normal speech and language. No gross focal neurologic deficits are appreciated. Speech is normal.  Skin:  Skin is warm, dry and intact. No rash noted. Psychiatric: Mood and affect are normal. Speech and behavior are normal. Patient exhibits appropriate insight and judgment.  ____________________________________________    LABS (pertinent positives/negatives)  Labs Reviewed  BASIC METABOLIC PANEL - Abnormal; Notable for the following:    Potassium 3.1 (*)    Glucose, Bld 105 (*)    Anion gap 4 (*)    All other components within normal limits  CBC - Abnormal; Notable for the following:    WBC 11.4 (*)    All other components within normal limits  TROPONIN I  FIBRIN DERIVATIVES D-DIMER ([facility] ONLY)     ____________________________________________   EKG  ED ECG REPORT I, BROWN, Donora N, the attending physician, personally viewed and interpreted this ECG.   Date: 11/14/2014  EKG Time: 10:23 PM  Rate: 72  Rhythm: No muscle sinus rhythm  Axis: none  Intervals: Normal  ST&T Change: None   ____________________________________________    RADIOLOGY  Chest x-ray revealed    DG Chest 2 View (Final result) Result time: 11/13/14 22:56:24   Final result by Rad Results In Interface (11/13/14 22:56:24)   Narrative:   CLINICAL DATA: Chest pain.  EXAM: CHEST 2 VIEW  COMPARISON: 01/24/2014  FINDINGS: The heart size and mediastinal contours are within normal limits. Both lungs are clear. The visualized skeletal structures are unremarkable.  IMPRESSION: No active cardiopulmonary disease.   Electronically Signed By: Signa Kell M.D. On: 11/13/2014 22:56            INITIAL IMPRESSION / ASSESSMENT AND PLAN / ED COURSE  Pertinent labs & imaging results that were available during my care of the patient were reviewed by me and considered in  my medical decision making (see chart for details).  Physical exam consistent with possible chest wall pain given the reproducible nature of the pain. Cardiac enzymes negative d-dimer also negative and a PERC 0 patient.   ____________________________________________   FINAL CLINICAL IMPRESSION(S) / ED DIAGNOSES  Final diagnoses:  None      Darci Current, MD 11/14/14 0126

## 2014-11-13 NOTE — ED Notes (Signed)
Lab notified of MD ordered add on study (d-dimer).

## 2014-11-14 LAB — FIBRIN DERIVATIVES D-DIMER (ARMC ONLY): Fibrin derivatives D-dimer (ARMC): 302 (ref 0–499)

## 2014-11-14 MED ORDER — ASPIRIN 81 MG PO CHEW
324.0000 mg | CHEWABLE_TABLET | Freq: Once | ORAL | Status: AC
  Administered 2014-11-14: 324 mg via ORAL
  Filled 2014-11-14: qty 4

## 2014-11-14 NOTE — ED Notes (Signed)
Dr. Manson Passey returns to bedside to speak with patient regarding results.

## 2014-11-14 NOTE — ED Notes (Signed)

## 2014-11-14 NOTE — Discharge Instructions (Signed)

## 2014-11-14 NOTE — ED Notes (Signed)
Patient reporting that visitors here to be with her. MD advised patient that visitors were not called for because she was being discharged. Of note, visitors were initially advised that patient was sleeping after patient was observed resting on her side in the room with lights out; sunglasses in place. Registration asked to have patient's visitors to have a seat and patient was in the process of being discharged; MD asked that visitors not be brought back so that he could speak with the patient. Visitors were only her approx 15 minutes prior to patient being discharged; notified by registration that they were here at 0120.

## 2015-08-06 ENCOUNTER — Encounter: Payer: Self-pay | Admitting: Emergency Medicine

## 2015-08-06 ENCOUNTER — Emergency Department
Admission: EM | Admit: 2015-08-06 | Discharge: 2015-08-06 | Disposition: A | Discharge status: Home / Self Care | Payer: Self-pay | Attending: Student | Admitting: Student

## 2015-08-06 DIAGNOSIS — I10 Essential (primary) hypertension: Secondary | ICD-10-CM | POA: Insufficient documentation

## 2015-08-06 DIAGNOSIS — Z791 Long term (current) use of non-steroidal anti-inflammatories (NSAID): Secondary | ICD-10-CM | POA: Insufficient documentation

## 2015-08-06 DIAGNOSIS — R079 Chest pain, unspecified: Secondary | ICD-10-CM | POA: Insufficient documentation

## 2015-08-06 DIAGNOSIS — G43009 Migraine without aura, not intractable, without status migrainosus: Secondary | ICD-10-CM | POA: Insufficient documentation

## 2015-08-06 DIAGNOSIS — R55 Syncope and collapse: Secondary | ICD-10-CM | POA: Insufficient documentation

## 2015-08-06 DIAGNOSIS — Z9622 Myringotomy tube(s) status: Secondary | ICD-10-CM | POA: Insufficient documentation

## 2015-08-06 MED ORDER — ONDANSETRON 4 MG PO TBDP
4.0000 mg | ORAL_TABLET | Freq: Once | ORAL | Status: AC
  Administered 2015-08-06: 4 mg via ORAL
  Filled 2015-08-06: qty 1

## 2015-08-06 MED ORDER — KETOROLAC TROMETHAMINE 30 MG/ML IJ SOLN
30.0000 mg | Freq: Once | INTRAMUSCULAR | Status: AC
  Administered 2015-08-06: 30 mg via INTRAVENOUS
  Filled 2015-08-06: qty 1

## 2015-08-06 MED ORDER — CYCLOBENZAPRINE HCL 5 MG PO TABS: 5.0000 mg | ORAL_TABLET | Freq: Three times a day (TID) | ORAL | Status: DC | PRN

## 2015-08-06 MED ORDER — HYDROCODONE-ACETAMINOPHEN 5-325 MG PO TABS
2.0000 | ORAL_TABLET | Freq: Once | ORAL | Status: AC
  Administered 2015-08-06: 2 via ORAL
  Filled 2015-08-06: qty 2

## 2015-08-06 MED ORDER — BUTALBITAL-APAP-CAFFEINE 50-325-40 MG PO TABS: 1.0000 | ORAL_TABLET | Freq: Four times a day (QID) | ORAL | Status: DC | PRN

## 2015-08-06 MED ORDER — IBUPROFEN 800 MG PO TABS: 800.0000 mg | ORAL_TABLET | Freq: Three times a day (TID) | ORAL | Status: DC | PRN

## 2015-08-06 NOTE — ED Notes (Signed)
Migraine x 3 days, history of same and states feels same as previous migraines. No head injury.

## 2015-08-06 NOTE — Discharge Instructions (Signed)
Recurrent Migraine Headache A migraine headache is an intense, throbbing pain on one or both sides of your head. Recurrent migraines keep coming back. A migraine can last for 30 minutes to several hours. CAUSES  The exact cause of a migraine headache is not always known. However, a migraine may be caused when nerves in the brain become irritated and release chemicals that cause inflammation. This causes pain. Certain things may also trigger migraines, such as:   Alcohol.  Smoking.  Stress.  Menstruation.  Aged cheeses.  Foods or drinks that contain nitrates, glutamate, aspartame, or tyramine.  Lack of sleep.  Chocolate.  Caffeine.  Hunger.  Physical exertion.  Fatigue.  Medicines used to treat chest pain (nitroglycerine), birth control pills, estrogen, and some blood pressure medicines. SYMPTOMS   Pain on one or both sides of your head.  Pulsating or throbbing pain.  Severe pain that prevents daily activities.  Pain that is aggravated by any physical activity.  Nausea, vomiting, or both.  Dizziness.  Pain with exposure to bright lights, loud noises, or activity.  General sensitivity to bright lights, loud noises, or smells. Before you get a migraine, you may get warning signs that a migraine is coming (aura). An aura may include:  Seeing flashing lights.  Seeing bright spots, halos, or zigzag lines.  Having tunnel vision or blurred vision.  Having feelings of numbness or tingling.  Having trouble talking.  Having muscle weakness. DIAGNOSIS  A recurrent migraine headache is often diagnosed based on:  Symptoms.  Physical examination.  A CT scan or MRI of your head. These imaging tests cannot diagnose migraines but can help rule out other causes of headaches.  TREATMENT  Medicines may be given for pain and nausea. Medicines can also be given to help prevent recurrent migraines. HOME CARE INSTRUCTIONS  Only take over-the-counter or prescription  medicines for pain or discomfort as directed by your health care provider. The use of long-term narcotics is not recommended.  Lie down in a dark, quiet room when you have a migraine.  Keep a journal to find out what may trigger your migraine headaches. For example, write down:  What you eat and drink.  How much sleep you get.  Any change to your diet or medicines.  Limit alcohol consumption.  Quit smoking if you smoke.  Get 7-9 hours of sleep, or as recommended by your health care provider.  Limit stress.  Keep lights dim if bright lights bother you and make your migraines worse. SEEK MEDICAL CARE IF:   You do not get relief from the medicines given to you.  You have a recurrence of pain.  You have a fever. SEEK IMMEDIATE MEDICAL CARE IF:  Your migraine becomes severe.  You have a stiff neck.  You have loss of vision.  You have muscular weakness or loss of muscle control.  You start losing your balance or have trouble walking.  You feel faint or pass out.  You have severe symptoms that are different from your first symptoms. MAKE SURE YOU:   Understand these instructions.  Will watch your condition.  Will get help right away if you are not doing well or get worse.   This information is not intended to replace advice given to you by your health care provider. Make sure you discuss any questions you have with your health care provider.   Document Released: 12/29/2000 Document Revised: 04/26/2014 Document Reviewed: 12/11/2012 Elsevier Interactive Patient Education 2016 [employer]  

## 2015-08-06 NOTE — ED Provider Notes (Signed)
[facility] Emergency Department Provider Note  ____________________________________________  Time seen: Approximately 10:13 AM  I have reviewed the triage vital signs and the nursing notes.   HISTORY  Chief Complaint Migraine    HPI Nicole Rowe is a 35 y.o. female who presents with complaints of migraine history for 3 days. Past medical history significant for the same and she reports that it feels the same as previous migraines no recent injury or trauma. Describes her pain as a bandlike pressure 8/10 nonradiating. Patient is bothered by lights and sounds. Has been fighting this headache for several weeks. Does not consider this to be the worst headache of her life. Patient reports that a shot usually helps her break the cycle to get back onto her normal daily routine.   Past Medical History  Diagnosis Date  . Syncope and collapse   . Hypertension   . Migraine     Patient Active Problem List   Diagnosis Date Noted  . Pain in the chest 01/11/2014    Past Surgical History  Procedure Laterality Date  . Tympanostomy tube placement    . Tubal ligation      Current Outpatient Rx  Name  Route  Sig  Dispense  Refill  . butalbital-acetaminophen-caffeine (FIORICET) 50-325-40 MG tablet   Oral   Take 1-2 tablets by mouth every 6 (six) hours as needed for headache.   20 tablet   0   . cyclobenzaprine (FLEXERIL) 5 MG tablet   Oral   Take 1 tablet (5 mg total) by mouth every 8 (eight) hours as needed for muscle spasms.   30 tablet   0   . ibuprofen (ADVIL,MOTRIN) 800 MG tablet   Oral   Take 1 tablet (800 mg total) by mouth every 8 (eight) hours as needed.   30 tablet   0     Allergies Penicillins  Family History  Problem Relation Age of Onset  . Arrhythmia Mother   . Hyperlipidemia Mother   . Heart attack Father   . Stroke Father   . Hypertension Father   . Hyperlipidemia Father     Social History Social History  Substance Use  Topics  . Smoking status: Never Smoker   . Smokeless tobacco: None  . Alcohol Use: No    Review of Systems Constitutional: No fever/chills Eyes: Positive visual changes in the sense of positive photophobia ENT: No sore throat. Cardiovascular: Denies chest pain. Respiratory: Denies shortness of breath. Gastrointestinal: No abdominal pain.  Positive nausea, no vomiting.  No diarrhea.  No constipation. Genitourinary: Negative for dysuria. Musculoskeletal: Negative for back pain. Skin: Negative for rash. Neurological: Positive headaches, negative for any focal weakness or numbness.  10-point ROS otherwise negative.  ____________________________________________   PHYSICAL EXAM:  VITAL SIGNS: ED Triage Vitals  Enc Vitals Group     BP 08/06/15 0911 132/89 mmHg     Pulse Rate 08/06/15 0911 60     Resp 08/06/15 0911 20     Temp 08/06/15 0911 97.8 F (36.6 C)     Temp Source 08/06/15 0911 Oral     SpO2 08/06/15 0911 99 %     Weight 08/06/15 0911 195 lb (88.451 kg)     Height 08/06/15 0911  (1.6 m)     Head Cir --      Peak Flow --      Pain Score 08/06/15 0912 8     Pain Loc --      Pain Edu? --  Excl. in GC? --     Constitutional: Alert and oriented. Well appearing and in no acute distress. Eyes: Conjunctivae are normal. PERRL. EOMI. Head: Atraumatic. Nose: No congestion/rhinnorhea. Mouth/Throat: Mucous membranes are moist.  Oropharynx non-erythematous. Neck: No stridor. Full range of motion nontender  Cardiovascular: Normal rate, regular rhythm. Grossly normal heart sounds.  Good peripheral circulation. Respiratory: Normal respiratory effort.  No retractions. Lungs CTAB. Musculoskeletal: No lower extremity tenderness nor edema.  No joint effusions. Neurologic:  Normal speech and language. No gross focal neurologic deficits are appreciated. No gait instability. Skin:  Skin is warm, dry and intact. No rash noted. Psychiatric: Mood and affect are normal. Speech  and behavior are normal.  ____________________________________________   LABS (all labs ordered are listed, but only abnormal results are displayed)  Labs Reviewed - No data to display    PROCEDURES  Procedure(s) performed: None  Critical Care performed: No  ____________________________________________   INITIAL IMPRESSION / ASSESSMENT AND PLAN / ED COURSE  Pertinent labs & imaging results that were available during my care of the patient were reviewed by me and considered in my medical decision making (see chart for details).  Acute exacerbation of a migraine headache. Patient was given Toradol 30 IVP Zofran 4 mg, 5/325 upon arrival. Patient states decrease in symptoms of her headache will be discharged home with a prescription for Fioricet and ibuprofen for onset of headaches. Encouraged to follow up with neurology for maintenance of her preventative treatment of migraine headaches. ____________________________________________   FINAL CLINICAL IMPRESSION(S) / ED DIAGNOSES  Final diagnoses:  Migraine without aura and without status migrainosus, not intractable     This chart was dictated using voice recognition software/Dragon. Despite best efforts to proofread, errors can occur which can change the meaning. Any change was purely unintentional.   [name], PA-C 08/06/15 1133  Gayla DossEryka A Gayle, MD 08/06/15 1524

## 2015-10-12 IMAGING — CR DG FOOT COMPLETE 3+V*L*
1 series · 3 of 3 positions shown · non-contrast
Comparison: none

REASON FOR EXAM: 1Todaro metatarsal/ arch tenderness
COMMENTS:

PROCEDURE:     DXR - DXR FOOT LT COMP W/OBLIQUES  - February 07, 2013 [DATE]
RESULT:     Three views of the left foot reveal the bones to be adequately
mineralized. There is no evidence of an acute fracture nor dislocation. The
overlying soft tissues are grossly normal.

[Series 1: ap · 0.17mm/px · 3 of 3 slices shown]
[im 1/3]
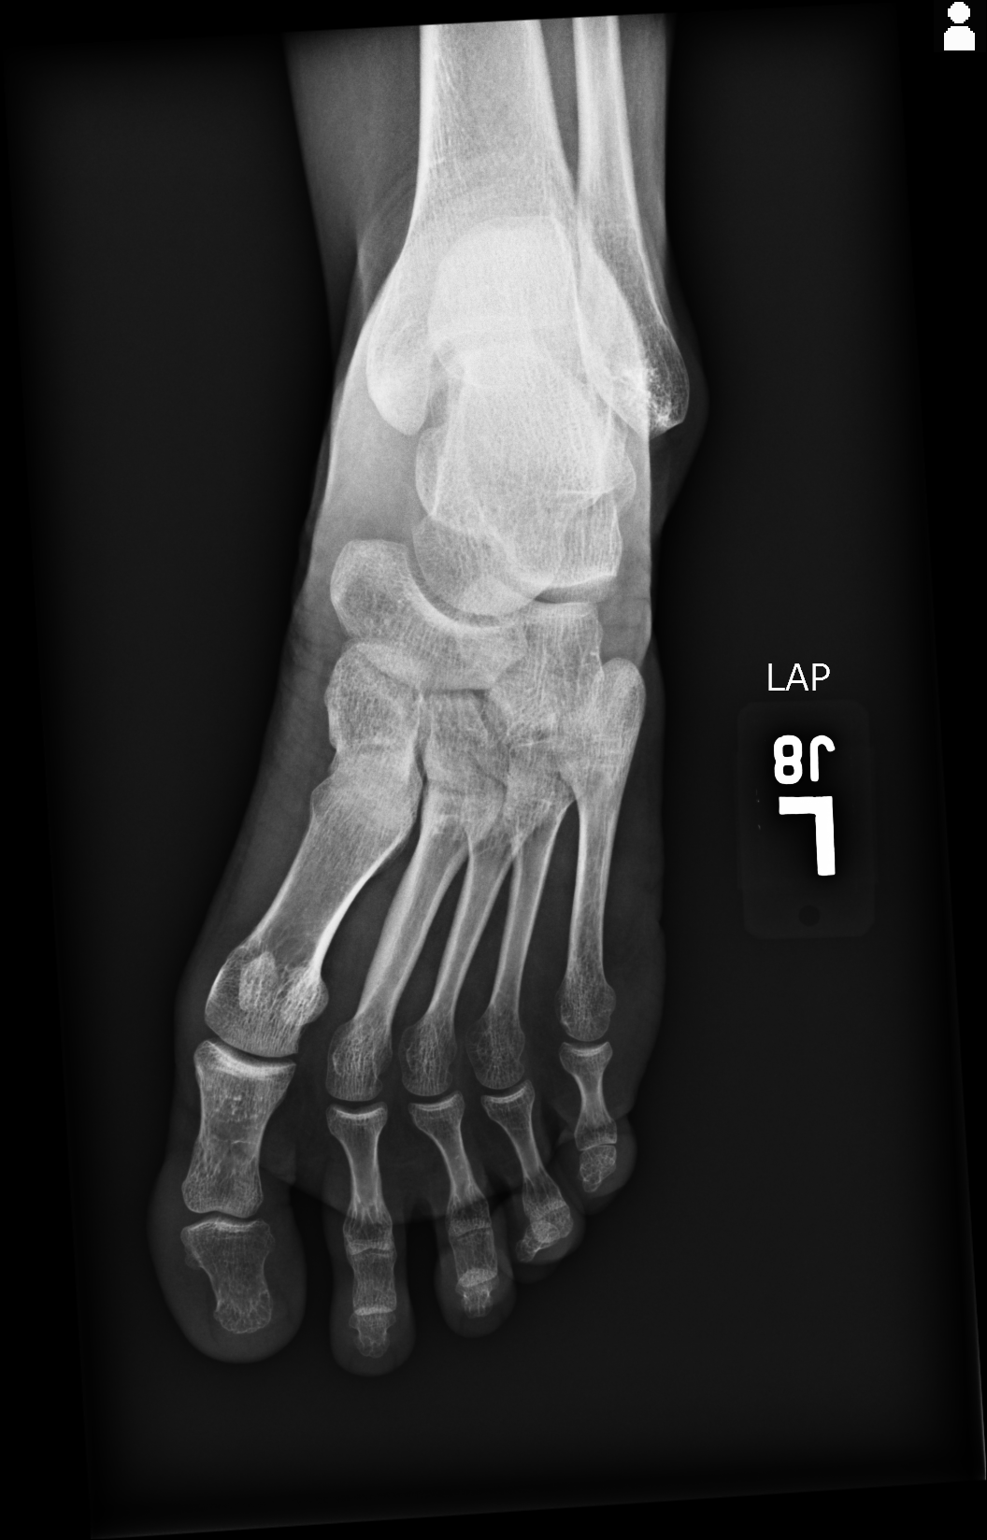
[im 2/3]
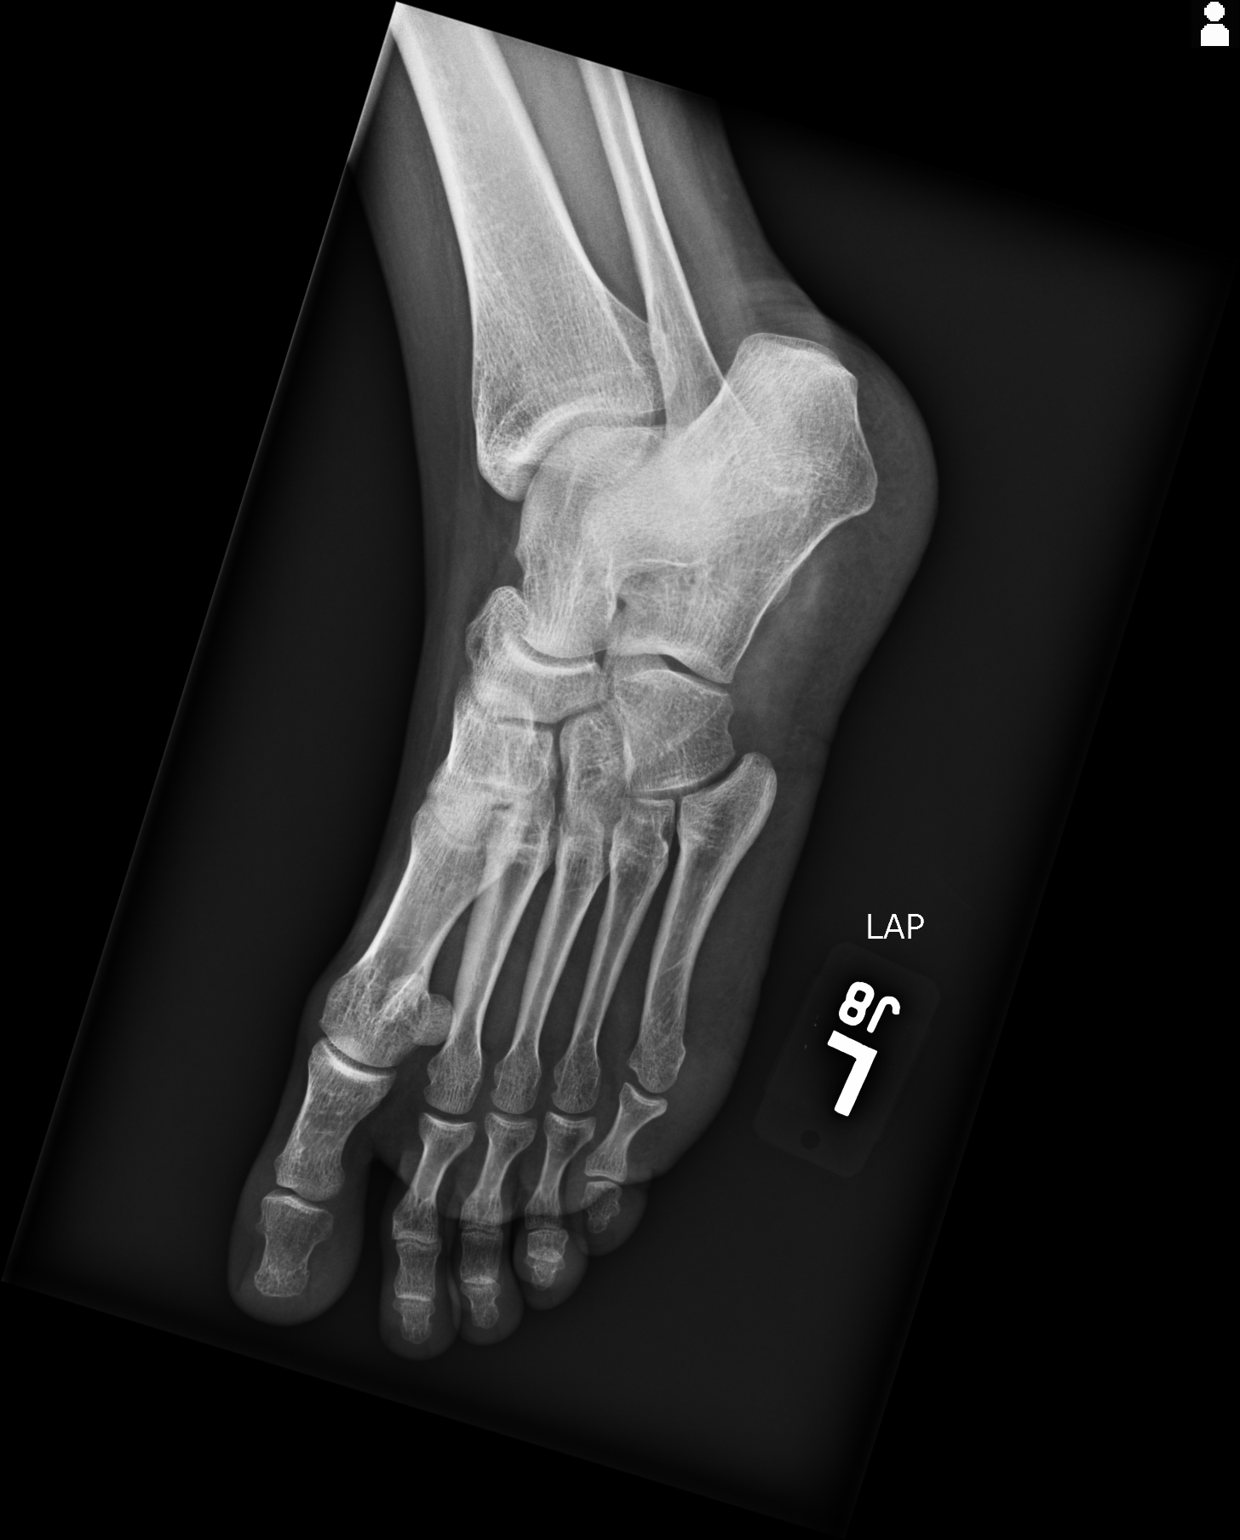
[im 3/3]
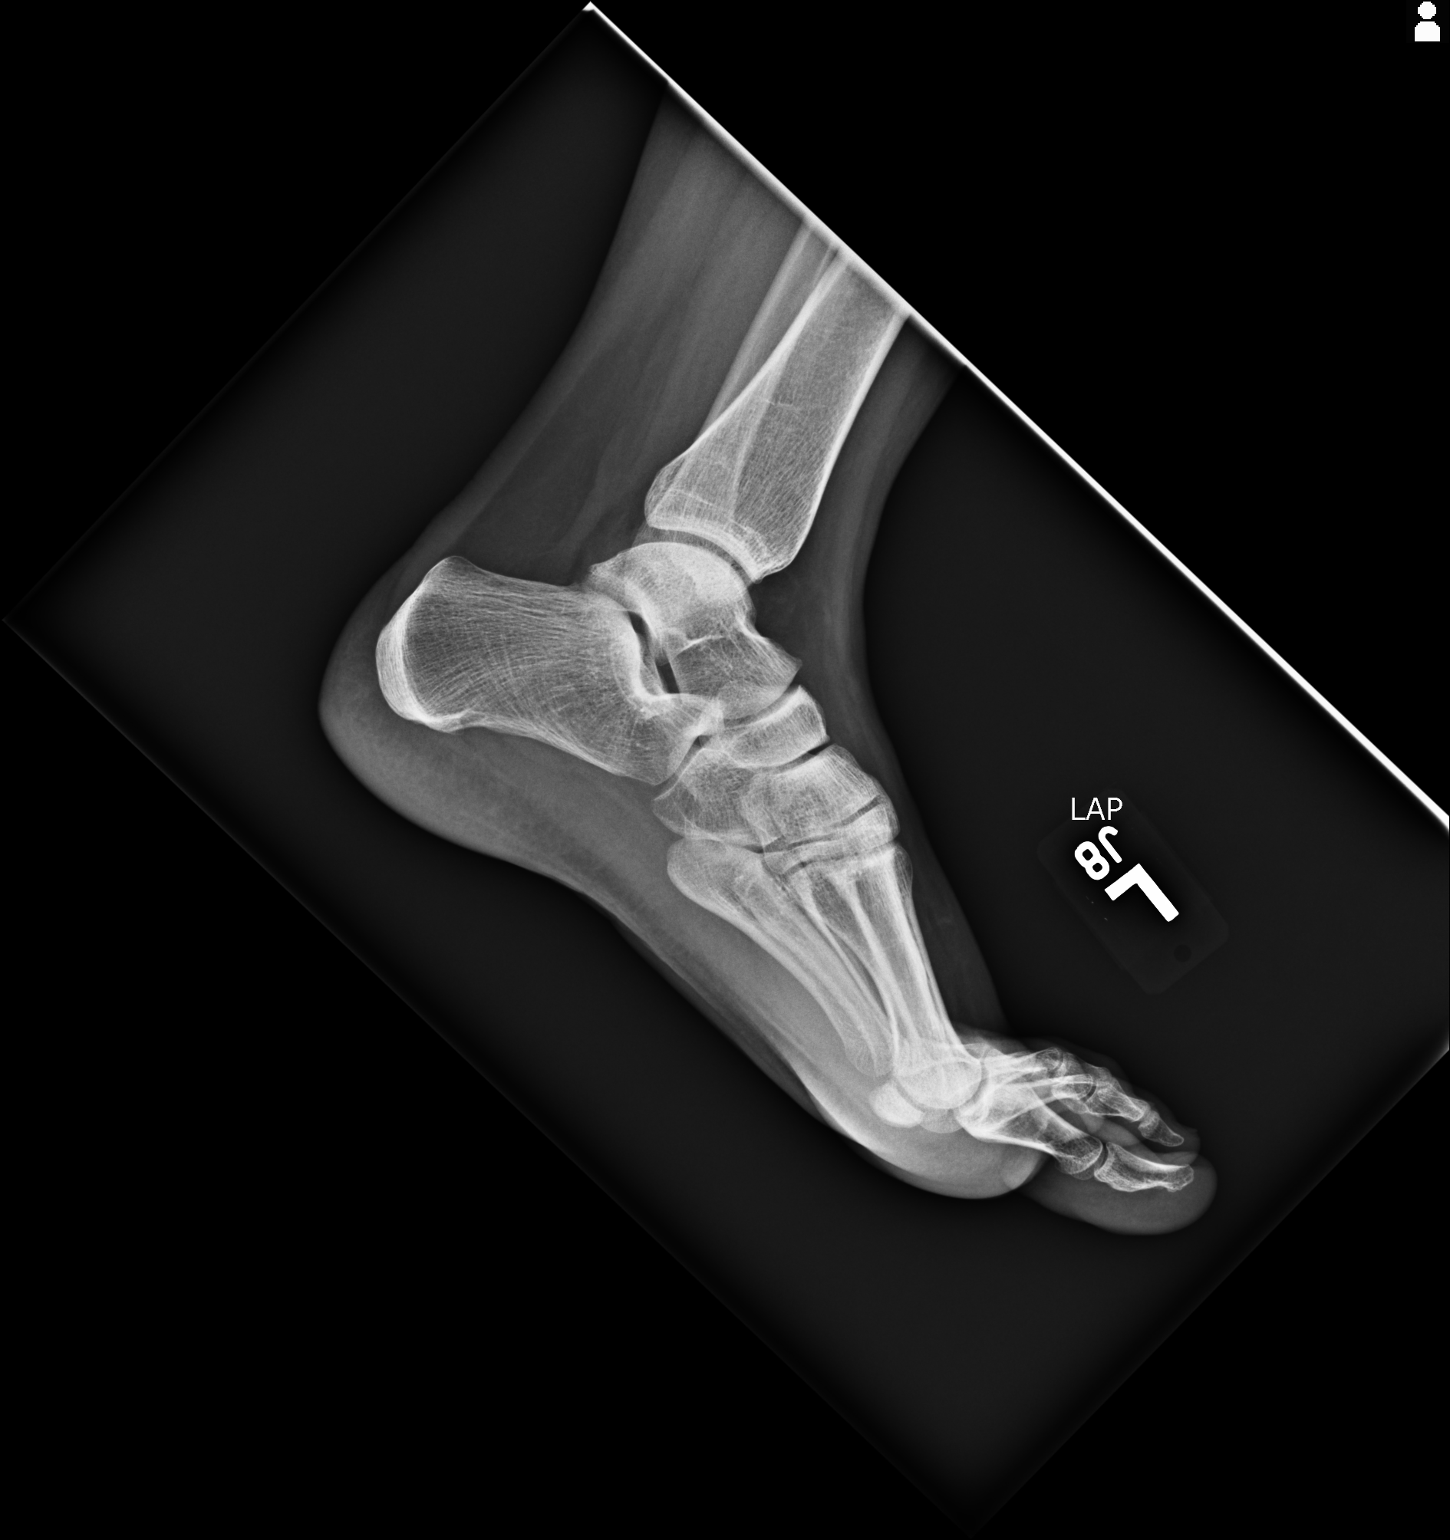

[3 of 3 positions shown; findings below may reference images not displayed]

IMPRESSION: No acute bony abnormality of the left foot is demonstrated.

[REDACTED]

## 2016-06-01 ENCOUNTER — Emergency Department
Admission: EM | Admit: 2016-06-01 | Discharge: 2016-06-01 | Disposition: A | Discharge status: Home / Self Care | Payer: Self-pay | Attending: Emergency Medicine | Admitting: Emergency Medicine

## 2016-06-01 ENCOUNTER — Encounter: Payer: Self-pay | Admitting: Emergency Medicine

## 2016-06-01 DIAGNOSIS — G43901 Migraine, unspecified, not intractable, with status migrainosus: Secondary | ICD-10-CM | POA: Insufficient documentation

## 2016-06-01 DIAGNOSIS — I10 Essential (primary) hypertension: Secondary | ICD-10-CM | POA: Insufficient documentation

## 2016-06-01 DIAGNOSIS — Z79899 Other long term (current) drug therapy: Secondary | ICD-10-CM | POA: Insufficient documentation

## 2016-06-01 LAB — POCT PREGNANCY, URINE: PREG TEST UR: NEGATIVE

## 2016-06-01 MED ORDER — KETOROLAC TROMETHAMINE 30 MG/ML IJ SOLN: 30.0000 mg | Freq: Once | INTRAMUSCULAR | Status: DC

## 2016-06-01 MED ORDER — KETOROLAC TROMETHAMINE 30 MG/ML IJ SOLN
10.0000 mg | Freq: Once | INTRAMUSCULAR | Status: AC
  Administered 2016-06-01: 9.9 mg via INTRAVENOUS
  Filled 2016-06-01: qty 1

## 2016-06-01 MED ORDER — DIPHENHYDRAMINE HCL 50 MG/ML IJ SOLN
25.0000 mg | Freq: Once | INTRAMUSCULAR | Status: AC
  Administered 2016-06-01: 25 mg via INTRAVENOUS
  Filled 2016-06-01: qty 1

## 2016-06-01 MED ORDER — METOCLOPRAMIDE HCL 5 MG/ML IJ SOLN
10.0000 mg | Freq: Once | INTRAMUSCULAR | Status: AC
  Administered 2016-06-01: 10 mg via INTRAVENOUS
  Filled 2016-06-01: qty 2

## 2016-06-01 MED ORDER — SODIUM CHLORIDE 0.9 % IV BOLUS (SEPSIS)
1000.0000 mL | Freq: Once | INTRAVENOUS | Status: AC
  Administered 2016-06-01: 1000 mL via INTRAVENOUS

## 2016-06-01 NOTE — ED Notes (Signed)
Pt states pain is 0/10, ready to be dc.

## 2016-06-01 NOTE — ED Provider Notes (Signed)
[facility] Emergency Department Provider Note  ____________________________________________  Time seen: Approximately 2:32 PM  I have reviewed the triage vital signs and the nursing notes.   HISTORY  Chief Complaint Migraine   HPI Nicole Rowe is a 36 y.o. female history of migraine headaches who presents for exacerbation of one of her migraines. Patient reportsa frontal severe headache throbbing in quality that has been present for the last 4 days. She has been taking Aleve and Excedrin without relief of her pain. She has had nausea and photophobia. The pain is identical to her prior migraines. Patient denies changes in vision, weakness or numbness, sudden onset HA or HA with maximal intensity at onset, fever, neck stiffness, history of immunosuppression, Jaw claudication, muscle aches, temporal artery pain, history of other household members with similar symptoms, pregnancy, clotting disorder, trauma, eye pain, recent cervical manipulation, or dizziness with headache.    Past Medical History:  Diagnosis Date  . Hypertension   . Migraine   . Syncope and collapse     Patient Active Problem List   Diagnosis Date Noted  . Pain in the chest 01/11/2014    Past Surgical History:  Procedure Laterality Date  . TUBAL LIGATION    . TYMPANOSTOMY TUBE PLACEMENT      Prior to Admission medications   Medication Sig Start Date End Date Taking? Authorizing Provider  ibuprofen (ADVIL,MOTRIN) 200 MG tablet Take 200 mg by mouth every 6 (six) hours as needed.   Yes Historical Provider, MD  naproxen sodium (ANAPROX) 220 MG tablet Take 220 mg by mouth 2 (two) times daily with a meal.   Yes Historical Provider, MD  butalbital-acetaminophen-caffeine (FIORICET) 50-325-40 MG tablet Take 1-2 tablets by mouth every 6 (six) hours as needed for headache. Patient not taking: Reported on 06/01/2016 08/06/15   Charmayne Sheer Beers, PA-C  cyclobenzaprine (FLEXERIL) 5 MG tablet Take 1  tablet (5 mg total) by mouth every 8 (eight) hours as needed for muscle spasms. Patient not taking: Reported on 06/01/2016 08/06/15   Charmayne Sheer Beers, PA-C  ibuprofen (ADVIL,MOTRIN) 800 MG tablet Take 1 tablet (800 mg total) by mouth every 8 (eight) hours as needed. Patient not taking: Reported on 06/01/2016 08/06/15   Evangeline Dakin, PA-C    Allergies Penicillins  Family History  Problem Relation Age of Onset  . Arrhythmia Mother   . Hyperlipidemia Mother   . Heart attack Father   . Stroke Father   . Hypertension Father   . Hyperlipidemia Father     Social History Social History  Substance Use Topics  . Smoking status: Never Smoker  . Smokeless tobacco: Never Used  . Alcohol use No    Review of Systems  Constitutional: Negative for fever. Eyes: Negative for visual changes. ENT: Negative for sore throat. Neck: No neck pain  Cardiovascular: Negative for chest pain. Respiratory: Negative for shortness of breath. Gastrointestinal: Negative for abdominal pain, vomiting or diarrhea. + nausea Genitourinary: Negative for dysuria. Musculoskeletal: Negative for back pain. Skin: Negative for rash. Neurological: Negative for weakness or numbness. + HA Psych: No SI or HI  ____________________________________________   PHYSICAL EXAM:  VITAL SIGNS: ED Triage Vitals [06/01/16 1310]  Enc Vitals Group     BP (!) 134/97     Pulse Rate 69     Resp 18     Temp 98.3 F (36.8 C)     Temp Source Oral     SpO2 100 %     Weight 198  lb (89.8 kg)     Height 5\' 3"  (1.6 m)     Head Circumference      Peak Flow      Pain Score 7     Pain Loc      Pain Edu?      Excl. in GC?     Constitutional: Alert and oriented, sitting in the room with sunglasses and under the covers.  HEENT:      Head: Normocephalic and atraumatic.         Eyes: Conjunctivae are normal. Sclera is non-icteric. EOMI. PERRL      Mouth/Throat: Mucous membranes are moist.       Neck: Supple with no signs of  meningismus. Cardiovascular: Regular rate and rhythm. No murmurs, gallops, or rubs. 2+ symmetrical distal pulses are present in all extremities. No JVD. Respiratory: Normal respiratory effort. Lungs are clear to auscultation bilaterally. No wheezes, crackles, or rhonchi.  Gastrointestinal: Soft, non tender, and non distended with positive bowel sounds. No rebound or guarding. Musculoskeletal: Nontender with normal range of motion in all extremities. No edema, cyanosis, or erythema of extremities. Neurologic: Normal speech and language. A & O x3, PERRL, no nystagmus, CN II-XII intact, motor testing reveals good tone and bulk throughout. There is no evidence of pronator drift or dysmetria. Muscle strength is 5/5 throughout. Deep tendon reflexes are 2+ throughout with downgoing toes. Sensory examination is intact. Gait is normal. Skin: Skin is warm, dry and intact. No rash noted. Psychiatric: Mood and affect are normal. Speech and behavior are normal.  ____________________________________________   LABS (all labs ordered are listed, but only abnormal results are displayed)  Labs Reviewed  POCT PREGNANCY, URINE   ____________________________________________  EKG  none  ____________________________________________  RADIOLOGY  none  ____________________________________________   PROCEDURES  Procedure(s) performed: None Procedures Critical Care performed:  None ____________________________________________   INITIAL IMPRESSION / ASSESSMENT AND PLAN / ED COURSE  female history of migraine headaches who presents for exacerbation of one of her migraines. Low suspicion for more serious or life threatening etiology of HA based on history and exam. No sudden onset thunderclap HA, onset with exertion, vomiting, focal neurologic deficits, to suggest increased risk of subarachnoid hemorrhage. No fever, neck pain, neck stiffness, or meningismus on exam to suggest meningitis. No fevers, altered  mental status, unusual behavior to suggest encephalitis. No focal neurologic deficits by history or exam to suggest central venous thrombosis. No constitutional symptoms including fever, fatigue, weight loss, temporal scalp tenderness, jaw claudication, visual loss, to suggest temporal arteritis. No immunocompromise to suggest increased risk for intracranial infectious disease. No visual changes or findings on ocular exam to suggest acute angle closure glaucoma. No reports of toxic exposures including carbon monoxide or other household members with similar symptoms.  Will give IV toradol, IVF, IV reglan, and IV benadryl.  Clinical Course as of Jun 02 1727  Tue Jun 01, 2016  1727 HA fully resolved. Patient requesting discharge. Patient remains well appearing and neuro intact with normal VS.  [CV]    Clinical Course User Index [CV] Nita Sicklearolina Jamere Stidham, MD    Pertinent labs & imaging results that were available during my care of the patient were reviewed by me and considered in my medical decision making (see chart for details).    ____________________________________________   FINAL CLINICAL IMPRESSION(S) / ED DIAGNOSES  Final diagnoses:  Migraine with status migrainosus, not intractable, unspecified migraine type      NEW MEDICATIONS STARTED DURING THIS VISIT:  New  Prescriptions   No medications on file     Note:  This document was prepared using Dragon voice recognition software and may include unintentional dictation errors.    Nita Sickle, MD 06/01/16 1730

## 2016-06-01 NOTE — ED Triage Notes (Signed)
Pt to ED from home c/o migraine x4 days.  States hx of migraines and is similar to ones in the past.  States pain is across front of head, lights smells and sound sensitivities.  States using OTC medication at home without relief, taken nothing today.

## 2016-06-01 NOTE — ED Notes (Signed)
Pt. Verbalizes understanding of d/c instructions and follow-up. VS stable and pain controlled per pt.  Pt. In NAD at time of d/c and denies further concerns regarding this visit. Pt. Stable at the time of departure from the unit, departing unit by the safest and most appropriate manner per that pt condition and limitations. Pt advised to return to the ED at any time for emergent concerns, or for new/worsening symptoms.   

## 2016-06-20 DIAGNOSIS — G43809 Other migraine, not intractable, without status migrainosus: Secondary | ICD-10-CM | POA: Insufficient documentation

## 2016-06-20 DIAGNOSIS — R002 Palpitations: Secondary | ICD-10-CM | POA: Insufficient documentation

## 2016-06-20 DIAGNOSIS — I159 Secondary hypertension, unspecified: Secondary | ICD-10-CM | POA: Insufficient documentation

## 2016-06-20 DIAGNOSIS — Z791 Long term (current) use of non-steroidal anti-inflammatories (NSAID): Secondary | ICD-10-CM | POA: Insufficient documentation

## 2016-06-20 NOTE — ED Notes (Signed)
Patient ambulatory to stat desk without difficulty or distress noted.  States here because blood pressure was 144/100 and heart rate was 122 at home.  Patient speaking in complete sentences without respiratory difficulty noted.

## 2016-06-21 ENCOUNTER — Emergency Department
Admission: EM | Admit: 2016-06-21 | Discharge: 2016-06-21 | Disposition: A | Discharge status: Home / Self Care | Payer: Self-pay | Attending: Emergency Medicine | Admitting: Emergency Medicine

## 2016-06-21 ENCOUNTER — Emergency Department: Payer: Self-pay

## 2016-06-21 DIAGNOSIS — R002 Palpitations: Secondary | ICD-10-CM

## 2016-06-21 DIAGNOSIS — G43809 Other migraine, not intractable, without status migrainosus: Secondary | ICD-10-CM

## 2016-06-21 DIAGNOSIS — I159 Secondary hypertension, unspecified: Secondary | ICD-10-CM

## 2016-06-21 LAB — BASIC METABOLIC PANEL
ANION GAP: 5 (ref 5–15)
BUN: 8 mg/dL (ref 6–20)
CO2: 27 mmol/L (ref 22–32)
Calcium: 8.6 mg/dL — ABNORMAL LOW (ref 8.9–10.3)
Chloride: 103 mmol/L (ref 101–111)
Creatinine, Ser: 0.6 mg/dL (ref 0.44–1.00)
GFR calc Af Amer: >60 mL/min (ref >60)
GFR calc non Af Amer: >60 mL/min (ref >60)
Glucose, Bld: 117 mg/dL — ABNORMAL HIGH (ref 65–99)
POTASSIUM: 4 mmol/L (ref 3.5–5.1)
SODIUM: 135 mmol/L (ref 135–145)

## 2016-06-21 LAB — CBC
HCT: 38.6 % (ref 35.0–47.0)
HEMOGLOBIN: 13.1 g/dL (ref 12.0–16.0)
MCH: 28.6 pg (ref 26.0–34.0)
MCHC: 34 g/dL (ref 32.0–36.0)
MCV: 84.2 fL (ref 80.0–100.0)
Platelets: 245 10*3/uL (ref 150–440)
RBC: 4.58 MIL/uL (ref 3.80–5.20)
RDW: 13.9 % (ref 11.5–14.5)
WBC: 12.4 10*3/uL — AB (ref 3.6–11.0)

## 2016-06-21 LAB — T4, FREE: FREE T4: 0.68 ng/dL (ref 0.61–1.12)

## 2016-06-21 LAB — TSH: TSH: 2.523 u[IU]/mL (ref 0.350–4.500)

## 2016-06-21 LAB — TROPONIN I: Troponin I: <0.03 ng/mL (ref <0.03)

## 2016-06-21 NOTE — ED Notes (Signed)
Patient c/o hypertension and headache. Pt reports BP of 144/102, with HR of 122 at home

## 2016-06-21 NOTE — ED Triage Notes (Signed)
Pt reports she woke up from sleep with a headache. States hx of migraines and she took her migraine medication which has helped and rates this migraine as less severe than normal. Pt states she just felt bad and she checked her blood pressure it was 144/102 and her pulse was 122 and that is why she came to the ED.Pt denies chest pain or shortness of breath.

## 2016-06-21 NOTE — ED Provider Notes (Signed)
[facility] Emergency Department Provider Note   ____________________________________________   First MD Initiated Contact with Patient 06/21/16 [phone]     (approximate)  I have reviewed the triage vital signs and the nursing notes.   HISTORY  Chief Complaint Headache and Hypertension    HPI Nicole Rowe is a 36 y.o. female who presents to the ED from home with a chief complaint of elevated blood pressure, palpitations and migraine headache. Patient has a history of recurrent migraine headaches; states yesterday she did not feel well and awoke from sleep with a typical migraine headache. Her PCP instructed her to take her blood pressure whenever she has a headache so patient took her blood pressure which was 144/102 with heart rate 122. Patient denies feeling irregular or missed beats. Presents to the ED mainly for elevated blood pressure and heart rate, not for typical migraine headache. Patient denies recent fever, chills, chest pain, shortness of breath, abdominal pain, nausea, vomiting, diarrhea. Drinks 5-6 [employer] per day. Denies recent travel or trauma. Without intervention, patient's blood pressure and heart rate had normalized.   Past Medical History:  Diagnosis Date  . Hypertension   . Migraine   . Syncope and collapse     Patient Active Problem List   Diagnosis Date Noted  . Pain in the chest 01/11/2014    Past Surgical History:  Procedure Laterality Date  . TUBAL LIGATION    . TYMPANOSTOMY TUBE PLACEMENT      Prior to Admission medications   Medication Sig Start Date End Date Taking? Authorizing Provider  butalbital-acetaminophen-caffeine (FIORICET) 50-325-40 MG tablet Take 1-2 tablets by mouth every 6 (six) hours as needed for headache. Patient not taking: Reported on 06/01/2016 08/06/15   Charmayne Sheer Beers, PA-C  cyclobenzaprine (FLEXERIL) 5 MG tablet Take 1 tablet (5 mg total) by mouth every 8 (eight) hours as needed for muscle  spasms. Patient not taking: Reported on 06/01/2016 08/06/15   Charmayne Sheer Beers, PA-C  ibuprofen (ADVIL,MOTRIN) 200 MG tablet Take 200 mg by mouth every 6 (six) hours as needed.    Historical Provider, MD  ibuprofen (ADVIL,MOTRIN) 800 MG tablet Take 1 tablet (800 mg total) by mouth every 8 (eight) hours as needed. Patient not taking: Reported on 06/01/2016 08/06/15   Charmayne Sheer Beers, PA-C  naproxen sodium (ANAPROX) 220 MG tablet Take 220 mg by mouth 2 (two) times daily with a meal.    Historical Provider, MD    Allergies Penicillins  Family History  Problem Relation Age of Onset  . Arrhythmia Mother   . Hyperlipidemia Mother   . Heart attack Father   . Stroke Father   . Hypertension Father   . Hyperlipidemia Father     Social History Social History  Substance Use Topics  . Smoking status: Never Smoker  . Smokeless tobacco: Never Used  . Alcohol use No    Review of Systems  Constitutional: No fever/chills. Eyes: No visual changes. ENT: No sore throat. Cardiovascular: Positive for fast heart rate. Denies chest pain. Respiratory: Denies shortness of breath. Gastrointestinal: No abdominal pain.  No nausea, no vomiting.  No diarrhea.  No constipation. Genitourinary: Negative for dysuria. Musculoskeletal: Negative for back pain. Skin: Negative for rash. Neurological: Positive for migraine headaches, focal weakness or numbness.  10-point ROS otherwise negative.  ____________________________________________   PHYSICAL EXAM:  VITAL SIGNS: ED Triage Vitals  Enc Vitals Group     BP 06/21/16 0004 126/88     Pulse Rate 06/21/16 0004 Marland Kitchen)  106     Resp 06/21/16 0004 18     Temp 06/21/16 0004 98.7 F (37.1 C)     Temp Source 06/21/16 0004 Oral     SpO2 06/21/16 0004 96 %     Weight 06/21/16 0004 198 lb (89.8 kg)     Height 06/21/16 0004 5\' 3"  (1.6 m)     Head Circumference --      Peak Flow --      Pain Score 06/21/16 0252 2     Pain Loc --      Pain Edu? --      Excl. in  GC? --     Constitutional: Alert and oriented. Well appearing and in no acute distress. Wearing sunglasses. Eyes: Conjunctivae are normal. PERRL. EOMI. Head: Atraumatic. Nose: No congestion/rhinnorhea. Mouth/Throat: Mucous membranes are moist.  Oropharynx non-erythematous. Neck: No stridor.  Supple neck without meningismus. No thyromegaly. No carotid bruits. Cardiovascular: Normal rate, regular rhythm. Grossly normal heart sounds.  Good peripheral circulation. Respiratory: Normal respiratory effort.  No retractions. Lungs CTAB. Gastrointestinal: Soft and nontender. No distention. No abdominal bruits. No CVA tenderness. Musculoskeletal: No lower extremity tenderness nor edema.  No joint effusions. Neurologic:  Normal speech and language. No gross focal neurologic deficits are appreciated. No gait instability. Skin:  Skin is warm, dry and intact. No rash noted. No petechiae. Psychiatric: Mood and affect are normal. Speech and behavior are normal.  ____________________________________________   LABS (all labs ordered are listed, but only abnormal results are displayed)  Labs Reviewed  BASIC METABOLIC PANEL - Abnormal; Notable for the following:       Result Value   Glucose, Bld 117 (*)    Calcium 8.6 (*)    All other components within normal limits  CBC - Abnormal; Notable for the following:    WBC 12.4 (*)    All other components within normal limits  TROPONIN I  TSH  T4, FREE   ____________________________________________  EKG  ED ECG REPORT I, Taiwo Fish J, the attending physician, personally viewed and interpreted this ECG.   Date: 06/21/2016  EKG Time: 0022  Rate: 102  Rhythm: sinus tachycardia  Axis: normal  Intervals:none  ST&T Change: nonspecific  ____________________________________________  RADIOLOGY  Chest x-ray interpreted per Dr. Clovis RileyMitchell: No active cardiopulmonary disease. ____________________________________________   PROCEDURES  Procedure(s)  performed: None  Procedures  Critical Care performed: No  ____________________________________________   INITIAL IMPRESSION / ASSESSMENT AND PLAN / ED COURSE  Pertinent labs & imaging results that were available during my care of the patient were reviewed by me and considered in my medical decision making (see chart for details).  36 year old female with recurrent migraine headaches who presents to the ED for evaluation oftachycardia and elevated blood pressure. Patient's heart rate and blood pressure are currently 94 and 122/82. Will add thyroid function. Will refer patient to cardiology for further outpatient workup possibly including Holter. Strict return precautions given. Patient and her father verbalize understanding and agree with plan of care.      ____________________________________________   FINAL CLINICAL IMPRESSION(S) / ED DIAGNOSES  Final diagnoses:  Other migraine without status migrainosus, not intractable  Secondary hypertension  Heart palpitations      NEW MEDICATIONS STARTED DURING THIS VISIT:  Discharge Medication List as of 06/21/2016  3:32 AM       Note:  This document was prepared using Dragon voice recognition software and may include unintentional dictation errors.    Irean HongJade J Dahlia Nifong, MD 06/21/16 [phone]

## 2016-06-21 NOTE — Discharge Instructions (Signed)
1. Keep a log of your blood pressure readings and take them to your appointment. 2. Decrease caffeine intake if possible. 3. Return to the ER for worsening symptoms, persistent vomiting, difficulty breathing or other concerns.

## 2016-10-04 ENCOUNTER — Encounter: Payer: Self-pay | Admitting: Emergency Medicine

## 2016-10-04 ENCOUNTER — Emergency Department
Admission: EM | Admit: 2016-10-04 | Discharge: 2016-10-04 | Discharge status: Against Medical Advice | Payer: Self-pay | Attending: Emergency Medicine | Admitting: Emergency Medicine

## 2016-10-04 DIAGNOSIS — G43909 Migraine, unspecified, not intractable, without status migrainosus: Secondary | ICD-10-CM | POA: Insufficient documentation

## 2016-10-04 NOTE — ED Triage Notes (Signed)
Pt presents with migraine for two day. Pt with hx of same but worse the last two days, has been taking advil with some relief.

## 2016-10-04 NOTE — ED Notes (Signed)
This RN called patient for a room.  Patient states she does not want to go back to a room, she wants to leave and go to urgent care.  This RN encouraged patient to stay and be seen by a provider but patient refused.

## 2016-10-11 ENCOUNTER — Emergency Department
Admission: EM | Admit: 2016-10-11 | Discharge: 2016-10-11 | Disposition: A | Discharge status: Home / Self Care | Payer: Self-pay | Attending: Emergency Medicine | Admitting: Emergency Medicine

## 2016-10-11 ENCOUNTER — Encounter: Payer: Self-pay | Admitting: Emergency Medicine

## 2016-10-11 ENCOUNTER — Emergency Department: Payer: Self-pay

## 2016-10-11 DIAGNOSIS — Y939 Activity, unspecified: Secondary | ICD-10-CM | POA: Insufficient documentation

## 2016-10-11 DIAGNOSIS — I10 Essential (primary) hypertension: Secondary | ICD-10-CM | POA: Insufficient documentation

## 2016-10-11 DIAGNOSIS — S93402A Sprain of unspecified ligament of left ankle, initial encounter: Secondary | ICD-10-CM | POA: Insufficient documentation

## 2016-10-11 DIAGNOSIS — Z791 Long term (current) use of non-steroidal anti-inflammatories (NSAID): Secondary | ICD-10-CM | POA: Insufficient documentation

## 2016-10-11 DIAGNOSIS — Y929 Unspecified place or not applicable: Secondary | ICD-10-CM | POA: Insufficient documentation

## 2016-10-11 DIAGNOSIS — Y999 Unspecified external cause status: Secondary | ICD-10-CM | POA: Insufficient documentation

## 2016-10-11 DIAGNOSIS — X58XXXA Exposure to other specified factors, initial encounter: Secondary | ICD-10-CM | POA: Insufficient documentation

## 2016-10-11 MED ORDER — NAPROXEN 500 MG PO TABS: 500.0000 mg | ORAL_TABLET | Freq: Two times a day (BID) | ORAL | Status: DC

## 2016-10-11 NOTE — ED Provider Notes (Signed)
[facility] Emergency Department Provider Note   ____________________________________________   First MD Initiated Contact with Patient 10/11/16 0825     (approximate)  I have reviewed the triage vital signs and the nursing notes.   HISTORY  Chief Complaint Ankle Pain    HPI Nicole Rowe is a 36 y.o. female patient complaining of left ankle pain for 3 days. Patient state onset status post helping father move some furniture.Patient rates pain as a 5/10. Patient stated the pain is on the plantar aspect of her heel and the lateral aspect of her ankle. Patient described a pain as "achy". No palliative measures for complaint.   Past Medical History:  Diagnosis Date  . Hypertension   . Migraine   . Syncope and collapse     Patient Active Problem List   Diagnosis Date Noted  . Pain in the chest 01/11/2014    Past Surgical History:  Procedure Laterality Date  . TUBAL LIGATION    . TYMPANOSTOMY TUBE PLACEMENT      Prior to Admission medications   Medication Sig Start Date End Date Taking? Authorizing Provider  butalbital-acetaminophen-caffeine (FIORICET) 50-325-40 MG tablet Take 1-2 tablets by mouth every 6 (six) hours as needed for headache. Patient not taking: Reported on 06/01/2016 08/06/15   Beers, Charmayne Sheer, PA-C  cyclobenzaprine (FLEXERIL) 5 MG tablet Take 1 tablet (5 mg total) by mouth every 8 (eight) hours as needed for muscle spasms. Patient not taking: Reported on 06/01/2016 08/06/15   Beers, Charmayne Sheer, PA-C  ibuprofen (ADVIL,MOTRIN) 200 MG tablet Take 200 mg by mouth every 6 (six) hours as needed.    [provider]  ibuprofen (ADVIL,MOTRIN) 800 MG tablet Take 1 tablet (800 mg total) by mouth every 8 (eight) hours as needed. Patient not taking: Reported on 06/01/2016 08/06/15   Evangeline Dakin, PA-C  naproxen (NAPROSYN) 500 MG tablet Take 1 tablet (500 mg total) by mouth 2 (two) times daily with a meal. 10/11/16   Joni Reining, PA-C   naproxen sodium (ANAPROX) 220 MG tablet Take 220 mg by mouth 2 (two) times daily with a meal.    [provider]    Allergies Penicillins  Family History  Problem Relation Age of Onset  . Arrhythmia Mother   . Hyperlipidemia Mother   . Heart attack Father   . Stroke Father   . Hypertension Father   . Hyperlipidemia Father     Social History Social History  Substance Use Topics  . Smoking status: Never Smoker  . Smokeless tobacco: Never Used  . Alcohol use No    Review of Systems  Constitutional: No fever/chills Eyes: No visual changes. ENT: No sore throat. Cardiovascular: Denies chest pain. Respiratory: Denies shortness of breath. Gastrointestinal: No abdominal pain.  No nausea, no vomiting.  No diarrhea.  No constipation. Genitourinary: Negative for dysuria. Musculoskeletal: Left ankle pain Skin: Negative for rash. Neurological: headaches, focal weakness or numbness.   ____________________________________________   PHYSICAL EXAM:  VITAL SIGNS: ED Triage Vitals  Enc Vitals Group     BP 10/11/16 0821 (!) 134/97     Pulse Rate 10/11/16 0821 68     Resp 10/11/16 0821 18     Temp 10/11/16 0821 97.6 F (36.4 C)     Temp Source 10/11/16 0821 Oral     SpO2 10/11/16 0821 100 %     Weight --      Height --      Head Circumference --  Peak Flow --      Pain Score 10/11/16 0816 5     Pain Loc --      Pain Edu? --      Excl. in GC? --     Constitutional: Alert and oriented. Well appearing and in no acute distress. Cardiovascular: Normal rate, regular rhythm. Grossly normal heart sounds.  Good peripheral circulation. Respiratory: Normal respiratory effort.  No retractions. Lungs CTAB. Musculoskeletal: No obvious deformity to the left ankle. Moderate guarding palpation at the lateral malleolus. Neurologic:  Normal speech and language. No gross focal neurologic deficits are appreciated. No gait instability. Skin:  Skin is warm, dry and intact. No  rash noted. Psychiatric: Mood and affect are normal. Speech and behavior are normal.  ____________________________________________   LABS (all labs ordered are listed, but only abnormal results are displayed)  Labs Reviewed - No data to display ____________________________________________  EKG   ____________________________________________  RADIOLOGY  Dg Ankle Complete Left  Result Date: 10/11/2016 CLINICAL DATA:  36 year old female with object falling on lateral aspect left ankle. Pain. Initial encounter. EXAM: LEFT ANKLE COMPLETE - 3+ VIEW COMPARISON:  10/03/2014. FINDINGS: No fracture or dislocation. Plantar spur. IMPRESSION: No fracture or dislocation. Electronically Signed   By: Lacy DuverneySteven  Olson M.D.   On: 10/11/2016 09:13    _Left ankle unremarkable except for small heel spur ___________________________________________   PROCEDURES  Procedure(s) performed: None  Procedures  Critical Care performed: No  ____________________________________________   INITIAL IMPRESSION / ASSESSMENT AND PLAN / ED COURSE  Pertinent labs & imaging results that were available during my care of the patient were reviewed by me and considered in my medical decision making (see chart for details).  Sprain left ankle and heel spur. Discussed x-ray finding with patient. Patient given discharge care instruction. Patient advised to follow-up with her PCP for continual care.      ____________________________________________   FINAL CLINICAL IMPRESSION(S) / ED DIAGNOSES  Final diagnoses:  Sprain of left ankle, unspecified ligament, initial encounter      NEW MEDICATIONS STARTED DURING THIS VISIT:  New Prescriptions   NAPROXEN (NAPROSYN) 500 MG TABLET    Take 1 tablet (500 mg total) by mouth 2 (two) times daily with a meal.     Note:  This document was prepared using Dragon voice recognition software and may include unintentional dictation errors.    Joni ReiningSmith, Adeleine Pask K,  PA-C 10/11/16 [id number]    Sharyn CreamerQuale, Mark, MD 10/16/16 1350

## 2016-10-11 NOTE — ED Notes (Signed)
See triage note  States she was helping her father move on Friday  Had a large [employer] cabinet fall onto left ankle/lower leg  Min swelling and bruising noted to lower leg   Just above ankle  Positive pulses

## 2016-10-11 NOTE — Discharge Instructions (Signed)
Advised to purchase over-the-counter heel spur support. They can be found at the pharmacy department of drug store.

## 2016-10-11 NOTE — ED Triage Notes (Signed)
Pt with left ankle pain since Saturday. Pt ambulatory to stat desk with no difficulty.

## 2016-11-09 ENCOUNTER — Encounter: Payer: Self-pay | Admitting: Medical Oncology

## 2016-11-09 ENCOUNTER — Emergency Department
Admission: EM | Admit: 2016-11-09 | Discharge: 2016-11-09 | Disposition: A | Discharge status: Home / Self Care | Payer: Self-pay | Attending: Student in an Organized Health Care Education/Training Program | Admitting: Student in an Organized Health Care Education/Training Program

## 2016-11-09 DIAGNOSIS — I10 Essential (primary) hypertension: Secondary | ICD-10-CM | POA: Insufficient documentation

## 2016-11-09 DIAGNOSIS — M7662 Achilles tendinitis, left leg: Secondary | ICD-10-CM | POA: Insufficient documentation

## 2016-11-09 MED ORDER — PREDNISONE 10 MG PO TABS: ORAL_TABLET | ORAL | 0 refills | Status: DC

## 2016-11-09 NOTE — ED Notes (Signed)
See triage note   States she developed pain to left foot 2 days ago w/o injury  States pain is mainly to the bottom of foot   Min swelling noted  Good pulses   Area is tender to touch

## 2016-11-09 NOTE — ED Triage Notes (Signed)
Pt reports left foot pain and swelling x 2 days without known injury. Pt ambulatory on foot.

## 2016-11-09 NOTE — ED Provider Notes (Signed)
[facility] Emergency Department Provider Note  ____________________________________________   First MD Initiated Contact with Patient 11/09/16 [phone]     (approximate)  I have reviewed the triage vital signs and the nursing notes.   HISTORY  Chief Complaint Foot Pain    HPI Nicole Rowe is a 36 y.o. female is here complaining of left foot pain and swelling for the last 2 days. Patient denies any injury. Patient was seen on 10/11/16 for possible injury to her left ankle and x-rays did not show any fractures. Currently patient is not taking any over-the-counter medication. She continues to ambulate without assistance. Current she rates her pain as a 6 out of 10. Pain is increased with walking.   Past Medical History:  Diagnosis Date  . Hypertension   . Migraine   . Syncope and collapse     Patient Active Problem List   Diagnosis Date Noted  . Pain in the chest 01/11/2014    Past Surgical History:  Procedure Laterality Date  . TUBAL LIGATION    . TYMPANOSTOMY TUBE PLACEMENT      Prior to Admission medications   Medication Sig Start Date End Date Taking? Authorizing Provider  predniSONE (DELTASONE) 10 MG tablet Take 6 tablets  today, on day 2 take 5 tablets, day 3 take 4 tablets, day 4 take 3 tablets, day 5 take  2 tablets and 1 tablet the last day 11/09/16   Tommi RumpsSummers, Rhonda L, PA-C    Allergies Penicillins  Family History  Problem Relation Age of Onset  . Arrhythmia Mother   . Hyperlipidemia Mother   . Heart attack Father   . Stroke Father   . Hypertension Father   . Hyperlipidemia Father     Social History Social History  Substance Use Topics  . Smoking status: Never Smoker  . Smokeless tobacco: Never Used  . Alcohol use No    Review of Systems Constitutional: No fever/chills Cardiovascular: Denies chest pain. Respiratory: Denies shortness of breath. Musculoskeletal: Positive for left foot pain. Skin: Negative for  rash. Neurological: Negative for  focal weakness or numbness.   ____________________________________________   PHYSICAL EXAM:  VITAL SIGNS: ED Triage Vitals  Enc Vitals Group     BP 11/09/16 0749 (!) 148/100     Pulse Rate 11/09/16 0749 78     Resp 11/09/16 0749 16     Temp 11/09/16 0749 97.8 F (36.6 C)     Temp Source 11/09/16 0749 Oral     SpO2 11/09/16 0749 100 %     Weight 11/09/16 0748 195 lb (88.5 kg)     Height --      Head Circumference --      Peak Flow --      Pain Score 11/09/16 0747 6     Pain Loc --      Pain Edu? --      Excl. in GC? --     Constitutional: Alert and oriented. Well appearing and in no acute distress. Eyes: Conjunctivae are normal.  Head: Atraumatic. Nose: No congestion/rhinnorhea. Neck: No stridor.   Hematological/Lymphatic/Immunilogical: No cervical lymphadenopathy. Cardiovascular: Normal rate, regular rhythm. Grossly normal heart sounds.  Good peripheral circulation. Respiratory: Normal respiratory effort.  No retractions. Lungs CTAB. Musculoskeletal: Examination of left foot there is no gross deformity noted. There is minimal soft tissue swelling however there is moderate tenderness on palpation diffusely over the dorsal aspect of left foot. There is also tenderness on palpation of the Achilles tendon. Patient  is able to flex and extend her foot but complains of pain. There is no abnormality on palpation of the Achilles tendon. Thompson's sign was negative for Achilles rupture. No ecchymosis, erythema or abrasions were noted to the skin in this area. Motor sensory function intact and digits move without any difficulty. Capillary refill is less than 3 seconds. Patient is able to bear weight and walk without assistance. Neurologic:  Normal speech and language. No gross focal neurologic deficits are appreciated.  Skin:  Skin is warm, dry and intact.  Psychiatric: Mood and affect are normal. Speech and behavior are  normal.  ____________________________________________   LABS (all labs ordered are listed, but only abnormal results are displayed)  Labs Reviewed - No data to display  PROCEDURES  Procedure(s) performed: None  Procedures  Critical Care performed: No  ____________________________________________   INITIAL IMPRESSION / ASSESSMENT AND PLAN / ED COURSE  Pertinent labs & imaging results that were available during my care of the patient were reviewed by me and considered in my medical decision making (see chart for details).  Patient is follow-up with her PCP to have her blood pressure rechecked as it was elevated in the emergency room. She was also placed in a postop shoe for support. X-ray was deferred since patient had just been here and x-rayed for complaint of her left foot and results were negative. Patient was given a prescription for prednisone taper 60 mg over the next 6 days. She can also use ice to the area as needed.      ____________________________________________   FINAL CLINICAL IMPRESSION(S) / ED DIAGNOSES  Final diagnoses:  Achilles tendinitis, left leg      NEW MEDICATIONS STARTED DURING THIS VISIT:  Discharge Medication List as of 11/09/2016  8:13 AM    START taking these medications   Details  predniSONE (DELTASONE) 10 MG tablet Take 6 tablets  today, on day 2 take 5 tablets, day 3 take 4 tablets, day 4 take 3 tablets, day 5 take  2 tablets and 1 tablet the last day, Print         Note:  This document was prepared using Dragon voice recognition software and may include unintentional dictation errors.    Tommi Rumps, PA-C 11/09/16 1147    Willy Eddy, MD 11/09/16 1246

## 2016-11-09 NOTE — Discharge Instructions (Signed)
You will need to follow up with your primary care provider Dr. Allena KatzPatel or Dr. Orland Jarredroxler,  who [facility]. Begin taking prednisone for the next 6 days as directed.  Also your blood pressure was elevated today at 148/100. When you make an appointment to see Dr. Allena KatzPatel you will need to have your blood pressure rechecked. Ice and elevate as needed for pain and/or swelling.

## 2016-11-18 ENCOUNTER — Telehealth: Payer: Self-pay | Admitting: Pharmacy Technician

## 2016-11-18 NOTE — Telephone Encounter (Signed)
Patient failed to provide 2018 proof of income.  No additional medication assistance will be provided by [facility] without the required proof of income documentation.  Patient notified by letter.  Wilmot Quevedo J. Tamana Hatfield Care Manager [facility] 

## 2017-01-17 ENCOUNTER — Emergency Department: Payer: Self-pay

## 2017-01-17 ENCOUNTER — Encounter: Payer: Self-pay | Admitting: Emergency Medicine

## 2017-01-17 ENCOUNTER — Emergency Department
Admission: EM | Admit: 2017-01-17 | Discharge: 2017-01-17 | Disposition: A | Discharge status: Home / Self Care | Payer: Self-pay | Attending: Emergency Medicine | Admitting: Emergency Medicine

## 2017-01-17 DIAGNOSIS — R05 Cough: Secondary | ICD-10-CM | POA: Insufficient documentation

## 2017-01-17 DIAGNOSIS — R059 Cough, unspecified: Secondary | ICD-10-CM

## 2017-01-17 DIAGNOSIS — Z5321 Procedure and treatment not carried out due to patient leaving prior to being seen by health care provider: Secondary | ICD-10-CM | POA: Insufficient documentation

## 2017-01-17 NOTE — ED Notes (Signed)
Explained the wait to patient

## 2017-01-17 NOTE — Discharge Instructions (Signed)
Patient's status examination room when I entered. Told by reception. Patient left

## 2017-01-17 NOTE — ED Notes (Addendum)
See triage note   Presents with cough for couple of days afebrile on arrival  Describes cough as "croupy"  Hx of bronchitis

## 2017-01-17 NOTE — ED Triage Notes (Signed)
Pt with croupy cough for two days.

## 2017-02-22 ENCOUNTER — Emergency Department: Payer: Medicaid Other

## 2017-02-22 ENCOUNTER — Other Ambulatory Visit: Payer: Self-pay

## 2017-02-22 ENCOUNTER — Emergency Department
Admission: EM | Admit: 2017-02-22 | Discharge: 2017-02-22 | Disposition: A | Discharge status: Home / Self Care | Payer: Medicaid Other | Attending: Student in an Organized Health Care Education/Training Program | Admitting: Student in an Organized Health Care Education/Training Program

## 2017-02-22 DIAGNOSIS — R079 Chest pain, unspecified: Secondary | ICD-10-CM | POA: Insufficient documentation

## 2017-02-22 DIAGNOSIS — I10 Essential (primary) hypertension: Secondary | ICD-10-CM | POA: Diagnosis not present

## 2017-02-22 DIAGNOSIS — F439 Reaction to severe stress, unspecified: Secondary | ICD-10-CM | POA: Insufficient documentation

## 2017-02-22 LAB — COMPREHENSIVE METABOLIC PANEL
ALK PHOS: 45 U/L (ref 38–126)
ALT: 22 U/L (ref 14–54)
AST: 20 U/L (ref 15–41)
Albumin: 4.3 g/dL (ref 3.5–5.0)
Anion gap: 7 (ref 5–15)
BILIRUBIN TOTAL: 0.9 mg/dL (ref 0.3–1.2)
BUN: 12 mg/dL (ref 6–20)
CALCIUM: 9.3 mg/dL (ref 8.9–10.3)
CHLORIDE: 104 mmol/L (ref 101–111)
CO2: 27 mmol/L (ref 22–32)
CREATININE: 0.77 mg/dL (ref 0.44–1.00)
GFR calc Af Amer: >60 mL/min (ref >60)
Glucose, Bld: 105 mg/dL — ABNORMAL HIGH (ref 65–99)
Potassium: 3.8 mmol/L (ref 3.5–5.1)
Sodium: 138 mmol/L (ref 135–145)
Total Protein: 7.8 g/dL (ref 6.5–8.1)

## 2017-02-22 LAB — CBC
HCT: 40.6 % (ref 35.0–47.0)
Hemoglobin: 13.6 g/dL (ref 12.0–16.0)
MCH: 28.9 pg (ref 26.0–34.0)
MCHC: 33.6 g/dL (ref 32.0–36.0)
MCV: 85.8 fL (ref 80.0–100.0)
PLATELETS: 291 10*3/uL (ref 150–440)
RBC: 4.73 MIL/uL (ref 3.80–5.20)
RDW: 14.3 % (ref 11.5–14.5)
WBC: 8.7 10*3/uL (ref 3.6–11.0)

## 2017-02-22 LAB — POCT PREGNANCY, URINE: PREG TEST UR: NEGATIVE

## 2017-02-22 LAB — TROPONIN I

## 2017-02-22 NOTE — ED Notes (Signed)
Pt reports chest pain "feels like someone sitting on my chest" Migraine and SHOB. Pt states pain is a 5/10.

## 2017-02-22 NOTE — ED Provider Notes (Signed)
[facility] Emergency Department Provider Note    First MD Initiated Contact with Patient 02/22/17 2028     (approximate)  I have reviewed the triage vital signs and the nursing notes.   HISTORY  Chief Complaint Chest Pain    HPI Nicole Rowe is a 36 y.o. female resents with chest pain and pressure that occurred while she was cooking dinner tonight.  Did feel overwhelming sense of doom and anxiety associated shortness of breath and went to lay down on her goddaughter's bed.  They called EMS who checked her blood pressure and it was reportedly elevated to the 150s.  States that today marks the 297-month anniversary of the death of her children's father and since then she has been having overwhelming stress trying to care for her with her finances and try not to lose their house to foreclosure.  She denies any chest pain or shortness of breath right now.  Does occasionally smoke.  No previous history of cardiac disease.  Past Medical History:  Diagnosis Date  . Hypertension   . Migraine   . Syncope and collapse    Family History  Problem Relation Age of Onset  . Arrhythmia Mother   . Hyperlipidemia Mother   . Heart attack Father   . Stroke Father   . Hypertension Father   . Hyperlipidemia Father    Past Surgical History:  Procedure Laterality Date  . TUBAL LIGATION    . TYMPANOSTOMY TUBE PLACEMENT     Patient Active Problem List   Diagnosis Date Noted  . Pain in the chest 01/11/2014      Prior to Admission medications   Medication Sig Start Date End Date Taking? Authorizing Provider  predniSONE (DELTASONE) 10 MG tablet Take 6 tablets  today, on day 2 take 5 tablets, day 3 take 4 tablets, day 4 take 3 tablets, day 5 take  2 tablets and 1 tablet the last day 11/09/16   Tommi RumpsSummers, Rhonda L, PA-C    Allergies Penicillins    Social History Social History   Tobacco Use  . Smoking status: Never Smoker  . Smokeless tobacco: Never Used    Substance Use Topics  . Alcohol use: No  . Drug use: No    Review of Systems Patient denies headaches, rhinorrhea, blurry vision, numbness, shortness of breath, chest pain, edema, cough, abdominal pain, nausea, vomiting, diarrhea, dysuria, fevers, rashes or hallucinations unless otherwise stated above in HPI. ____________________________________________   PHYSICAL EXAM:  VITAL SIGNS: Vitals:   02/22/17 1909 02/22/17 2032  BP: (!) 137/91 (!) 134/94  Pulse: 85   Resp: 18 19  Temp: 98.2 F (36.8 C) 98.2 F (36.8 C)  SpO2: 95% 99%    Constitutional: Alert and oriented. Well appearing and in no acute distress. Eyes: Conjunctivae are normal.  Head: Atraumatic. Nose: No congestion/rhinnorhea. Mouth/Throat: Mucous membranes are moist.   Neck: No stridor. Painless ROM.  Cardiovascular: Normal rate, regular rhythm. Grossly normal heart sounds.  Good peripheral circulation. Respiratory: Normal respiratory effort.  No retractions. Lungs CTAB. Gastrointestinal: Soft and nontender. No distention. No abdominal bruits. No CVA tenderness. Genitourinary:  Musculoskeletal: No lower extremity tenderness nor edema.  No joint effusions. Neurologic:  Normal speech and language. No gross focal neurologic deficits are appreciated. No facial droop Skin:  Skin is warm, dry and intact. No rash noted. Psychiatric: Mood and affect are normal. Speech and behavior are normal.  ____________________________________________   LABS (all labs ordered are listed, but only abnormal results  are displayed)  Results for orders placed or performed during the hospital encounter of 02/22/17 (from the past 24 hour(s))  CBC     Status: None   Collection Time: 02/22/17  7:08 PM  Result Value Ref Range   WBC 8.7 3.6 - 11.0 K/uL   RBC 4.73 3.80 - 5.20 MIL/uL   Hemoglobin 13.6 12.0 - 16.0 g/dL   HCT 16.1 09.6 - 04.5 %   MCV 85.8 80.0 - 100.0 fL   MCH 28.9 26.0 - 34.0 pg   MCHC 33.6 32.0 - 36.0 g/dL   RDW 40.9  81.1 - 91.4 %   Platelets 291 150 - 440 K/uL  Comprehensive metabolic panel     Status: Abnormal   Collection Time: 02/22/17  7:08 PM  Result Value Ref Range   Sodium 138 135 - 145 mmol/L   Potassium 3.8 3.5 - 5.1 mmol/L   Chloride 104 101 - 111 mmol/L   CO2 27 22 - 32 mmol/L   Glucose, Bld 105 (H) 65 - 99 mg/dL   BUN 12 6 - 20 mg/dL   Creatinine, Ser 7.82 0.44 - 1.00 mg/dL   Calcium 9.3 8.9 - 95.6 mg/dL   Total Protein 7.8 6.5 - 8.1 g/dL   Albumin 4.3 3.5 - 5.0 g/dL   AST 20 15 - 41 U/L   ALT 22 14 - 54 U/L   Alkaline Phosphatase 45 38 - 126 U/L   Total Bilirubin 0.9 0.3 - 1.2 mg/dL   GFR calc non Af Amer >60 >60 mL/min   GFR calc Af Amer >60 >60 mL/min   Anion gap 7 5 - 15  Troponin I     Status: None   Collection Time: 02/22/17  7:08 PM  Result Value Ref Range   Troponin I <0.03 <0.03 ng/mL  Pregnancy, urine POC     Status: None   Collection Time: 02/22/17  8:47 PM  Result Value Ref Range   Preg Test, Ur NEGATIVE NEGATIVE   ____________________________________________  EKG My review and personal interpretation at Time: 19:06   Indication: chest pain  Rate: 85  Rhythm: sinus Axis: normal Other: normal intervals, no stemi, no brugada or wpw ____________________________________________  RADIOLOGY  I personally reviewed all radiographic images ordered to evaluate for the above acute complaints and reviewed radiology reports and findings.  These findings were personally discussed with the patient.  Please see medical record for radiology report.  ____________________________________________   PROCEDURES  Procedure(s) performed:  Procedures    Critical Care performed: no ____________________________________________   INITIAL IMPRESSION / ASSESSMENT AND PLAN / ED COURSE  Pertinent labs & imaging results that were available during my care of the patient were reviewed by me and considered in my medical decision making (see chart for details).  DDX: ACS,  pericarditis, esophagitis, boerhaaves, pe, dissection, pna, bronchitis, costochondritis   Cesiah Westley is a 36 y.o. who presents to the ED with chest pain as described above.  Patient is low risk by Wells and is pertinent negative.  She is low risk heart score of 1.  Chest x-ray shows no evidence of pneumothorax or pneumonia.  She has no evidence of asthma or bronchitis.  She is well-appearing and in no acute distress.  I do suspect some component of a stress-induced anxiety.  Likely panic attack.  Will give referral to PCP as well as [facility].  Have discussed with the patient and available family all diagnostics and treatments performed thus far and all questions were answered  to the best of my ability. The patient demonstrates understanding and agreement with plan.       ____________________________________________   FINAL CLINICAL IMPRESSION(S) / ED DIAGNOSES  Final diagnoses:  Chest pain, unspecified type  Stress      NEW MEDICATIONS STARTED DURING THIS VISIT:  This SmartLink is deprecated. Use AVSMEDLIST instead to display the medication list for a patient.   Note:  This document was prepared using Dragon voice recognition software and may include unintentional dictation errors.    Willy Eddyobinson, Emmer Lillibridge, MD 02/22/17 2133

## 2017-02-22 NOTE — ED Triage Notes (Signed)
Pt in with co mid sternal chest pain with some shob x 4 hrs. No shob noted at this time no cardiac history.

## 2017-04-04 ENCOUNTER — Encounter: Payer: Self-pay | Admitting: Emergency Medicine

## 2017-04-04 ENCOUNTER — Emergency Department
Admission: EM | Admit: 2017-04-04 | Discharge: 2017-04-04 | Disposition: A | Discharge status: Home / Self Care | Payer: Medicaid Other | Attending: Emergency Medicine | Admitting: Emergency Medicine

## 2017-04-04 ENCOUNTER — Emergency Department: Payer: Medicaid Other

## 2017-04-04 ENCOUNTER — Other Ambulatory Visit: Payer: Self-pay

## 2017-04-04 DIAGNOSIS — I10 Essential (primary) hypertension: Secondary | ICD-10-CM | POA: Diagnosis not present

## 2017-04-04 DIAGNOSIS — M79674 Pain in right toe(s): Secondary | ICD-10-CM | POA: Diagnosis not present

## 2017-04-04 DIAGNOSIS — F17200 Nicotine dependence, unspecified, uncomplicated: Secondary | ICD-10-CM | POA: Diagnosis not present

## 2017-04-04 MED ORDER — MELOXICAM 15 MG PO TABS: 15.0000 mg | ORAL_TABLET | Freq: Every day | ORAL | 1 refills | Status: AC

## 2017-04-04 NOTE — ED Provider Notes (Signed)
[facility] Emergency Department Provider Note  ____________________________________________  Time seen: Approximately 11:01 PM  I have reviewed the triage vital signs and the nursing notes.   HISTORY  Chief Complaint Toe Injury    HPI Nicole Rowe is a 36 y.o. female presents to the emergency department with 10 out of 10 left fifth digit pain after a can of Pam cooking spray dropped on patient's foot.  No weakness, radiculopathy or changes in sensation of the lower extremities.  No skin compromise.  No alleviating measures of been attempted.   Past Medical History:  Diagnosis Date  . Hypertension   . Migraine   . Syncope and collapse     Patient Active Problem List   Diagnosis Date Noted  . Pain in the chest 01/11/2014    Past Surgical History:  Procedure Laterality Date  . TUBAL LIGATION    . TYMPANOSTOMY TUBE PLACEMENT      Prior to Admission medications   Medication Sig Start Date End Date Taking? Authorizing Provider  meloxicam (MOBIC) 15 MG tablet Take 1 tablet (15 mg total) by mouth daily for 7 days. 04/04/17 04/11/17  Orvil FeilWoods, Renaud Celli M, PA-C  predniSONE (DELTASONE) 10 MG tablet Take 6 tablets  today, on day 2 take 5 tablets, day 3 take 4 tablets, day 4 take 3 tablets, day 5 take  2 tablets and 1 tablet the last day 11/09/16   Tommi RumpsSummers, Rhonda L, PA-C    Allergies Penicillins  Family History  Problem Relation Age of Onset  . Arrhythmia Mother   . Hyperlipidemia Mother   . Heart attack Father   . Stroke Father   . Hypertension Father   . Hyperlipidemia Father     Social History Social History   Tobacco Use  . Smoking status: Current Some Day Smoker  . Smokeless tobacco: Never Used  Substance Use Topics  . Alcohol use: No  . Drug use: No     Review of Systems  Constitutional: No fever/chills Eyes: No visual changes. No discharge ENT: No upper respiratory complaints. Cardiovascular: no chest pain. Respiratory: no cough.  No SOB. Musculoskeletal: Patient has left fifth toe pain. Skin: Negative for rash, abrasions, lacerations, ecchymosis. Neurological: Negative for headaches, focal weakness or numbness.   ____________________________________________   PHYSICAL EXAM:  VITAL SIGNS: ED Triage Vitals  Enc Vitals Group     BP 04/04/17 2216 (!) 135/100     Pulse Rate 04/04/17 2216 84     Resp 04/04/17 2216 18     Temp 04/04/17 2216 97.6 F (36.4 C)     Temp Source 04/04/17 2216 Oral     SpO2 04/04/17 2216 98 %     Weight 04/04/17 2217 195 lb (88.5 kg)     Height 04/04/17 2217 5\' 3"  (1.6 m)     Head Circumference --      Peak Flow --      Pain Score 04/04/17 2216 5     Pain Loc --      Pain Edu? --      Excl. in GC? --      Constitutional: Alert and oriented. Well appearing and in no acute distress. Eyes: Conjunctivae are normal. PERRL. EOMI. Head: Atraumatic. Cardiovascular: Normal rate, regular rhythm. Normal S1 and S2.  Good peripheral circulation. Respiratory: Normal respiratory effort without tachypnea or retractions. Lungs CTAB. Good air entry to the bases with no decreased or absent breath sounds. Musculoskeletal: Patient is able to move all 5 left toes.  Patient performs full range of motion at the left ankle with no tenderness elicited over the course of the fibula.  Palpable dorsalis pedis pulse, left. Neurologic:  Normal speech and language. No gross focal neurologic deficits are appreciated.  Skin:  Skin is warm, dry and intact. No rash noted. Psychiatric: Mood and affect are normal. Speech and behavior are normal. Patient exhibits appropriate insight and judgement.   ____________________________________________   LABS (all labs ordered are listed, but only abnormal results are displayed)  Labs Reviewed - No data to display ____________________________________________  EKG   ____________________________________________  RADIOLOGY Geraldo PitterI, Finnigan Warriner M Zissy Hamlett, personally viewed and  evaluated these images (plain radiographs) as part of my medical decision making, as well as reviewing the written report by the radiologist.  Dg Toe 5th Right  Result Date: 04/04/2017 CLINICAL DATA:  Dropped can on toe EXAM: RIGHT FIFTH TOE:  3 V COMPARISON:  None. FINDINGS: Frontal, oblique, and lateral views were obtained. No fracture or dislocation. Joint spaces appear normal. No erosive change. IMPRESSION: No fracture or dislocation.  No evident arthropathy. Electronically Signed   By: Bretta BangWilliam  Woodruff III M.D.   On: 04/04/2017 22:52    ____________________________________________    PROCEDURES  Procedure(s) performed:    Procedures    Medications - No data to display   ____________________________________________   INITIAL IMPRESSION / ASSESSMENT AND PLAN / ED COURSE  Pertinent labs & imaging results that were available during my care of the patient were reviewed by me and considered in my medical decision making (see chart for details).  Review of the [place] CSRS was performed in accordance of the [facility] prior to dispensing any controlled drugs.     Assessment and plan Left toe contusion Differential diagnosis includes toe contusion versus toe fracture. Patient presents to the emergency department with left toe pain.  Overall physical exam was reassuring.  X-ray examination reveals no fractures or bony abnormalities.  Patient was discharged with meloxicam and referred to podiatry, Dr. Orland Jarredroxler.  All patient questions were answered.     ____________________________________________  FINAL CLINICAL IMPRESSION(S) / ED DIAGNOSES  Final diagnoses:  Toe pain, right      NEW MEDICATIONS STARTED DURING THIS VISIT:  ED Discharge Orders        Ordered    meloxicam (MOBIC) 15 MG tablet  Daily     04/04/17 2259          This chart was dictated using voice recognition software/Dragon. Despite best efforts to proofread, errors can occur which can change the  meaning. Any change was purely unintentional.    Orvil FeilWoods, Mariane Burpee M, PA-C 04/04/17 2303    Dionne BucySiadecki, Sebastian, MD 04/04/17 [phone]

## 2017-04-04 NOTE — ED Triage Notes (Signed)
Pain R toe. Dropped can on it about 1 hour ago.

## 2017-04-26 ENCOUNTER — Other Ambulatory Visit: Payer: Self-pay

## 2017-04-26 ENCOUNTER — Encounter: Payer: Self-pay | Admitting: Emergency Medicine

## 2017-04-26 ENCOUNTER — Emergency Department
Admission: EM | Admit: 2017-04-26 | Discharge: 2017-04-26 | Disposition: A | Discharge status: Home / Self Care | Payer: Medicaid Other | Attending: Emergency Medicine | Admitting: Emergency Medicine

## 2017-04-26 DIAGNOSIS — Y999 Unspecified external cause status: Secondary | ICD-10-CM | POA: Diagnosis not present

## 2017-04-26 DIAGNOSIS — I10 Essential (primary) hypertension: Secondary | ICD-10-CM | POA: Insufficient documentation

## 2017-04-26 DIAGNOSIS — Y92039 Unspecified place in apartment as the place of occurrence of the external cause: Secondary | ICD-10-CM | POA: Insufficient documentation

## 2017-04-26 DIAGNOSIS — Y9389 Activity, other specified: Secondary | ICD-10-CM | POA: Insufficient documentation

## 2017-04-26 DIAGNOSIS — X500XXA Overexertion from strenuous movement or load, initial encounter: Secondary | ICD-10-CM | POA: Diagnosis not present

## 2017-04-26 DIAGNOSIS — F17210 Nicotine dependence, cigarettes, uncomplicated: Secondary | ICD-10-CM | POA: Insufficient documentation

## 2017-04-26 DIAGNOSIS — S4992XA Unspecified injury of left shoulder and upper arm, initial encounter: Secondary | ICD-10-CM | POA: Diagnosis present

## 2017-04-26 DIAGNOSIS — S46912A Strain of unspecified muscle, fascia and tendon at shoulder and upper arm level, left arm, initial encounter: Secondary | ICD-10-CM

## 2017-04-26 MED ORDER — MELOXICAM 15 MG PO TABS: 15.0000 mg | ORAL_TABLET | Freq: Every day | ORAL | 2 refills | Status: AC

## 2017-04-26 MED ORDER — BACLOFEN 10 MG PO TABS: 10.0000 mg | ORAL_TABLET | Freq: Every day | ORAL | 1 refills | Status: AC

## 2017-04-26 NOTE — ED Notes (Signed)
Pt able to move left arm, strong grip. No obvious injury.

## 2017-04-26 NOTE — ED Triage Notes (Signed)
Pt was moving furniture in storage building by self. Has been having left shoulder pain with movement since then.

## 2017-04-26 NOTE — ED Provider Notes (Signed)
[facility] Emergency Department Provider Note  ____________________________________________   First MD Initiated Contact with Patient 04/26/17 [phone]     (approximate)  I have reviewed the triage vital signs and the nursing notes.   HISTORY  Chief Complaint Shoulder Pain    HPI Nicole Rowe is a 37 y.o. female who complains of left shoulder pain for several days, she states she moved everything out of her apartment by herself on Friday about 4 days ago, since then the left shoulder has been painful, she states it is very stiff and painful to move, she took 1 dose of ibuprofen without any relief, she has not used ice or wet heat, she denies any numbness or tingling   Past Medical History:  Diagnosis Date  . Hypertension   . Migraine   . Syncope and collapse     Patient Active Problem List   Diagnosis Date Noted  . Pain in the chest 01/11/2014    Past Surgical History:  Procedure Laterality Date  . TUBAL LIGATION    . TYMPANOSTOMY TUBE PLACEMENT      Prior to Admission medications   Medication Sig Start Date End Date Taking? Authorizing Provider  baclofen (LIORESAL) 10 MG tablet Take 1 tablet (10 mg total) by mouth daily. 04/26/17 04/26/18  Stormee Duda, Roselyn Bering, PA-C  meloxicam (MOBIC) 15 MG tablet Take 1 tablet (15 mg total) by mouth daily. 04/26/17 04/26/18  Faythe Ghee, PA-C    Allergies Penicillins  Family History  Problem Relation Age of Onset  . Arrhythmia Mother   . Hyperlipidemia Mother   . Heart attack Father   . Stroke Father   . Hypertension Father   . Hyperlipidemia Father     Social History Social History   Tobacco Use  . Smoking status: Current Some Day Smoker  . Smokeless tobacco: Never Used  Substance Use Topics  . Alcohol use: No  . Drug use: No    Review of Systems  Constitutional: No fever/chills Eyes: No visual changes. ENT: No sore throat. Respiratory: Denies cough Genitourinary: Negative for  dysuria. Musculoskeletal: Negative for back pain.  Positive for left shoulder pain Skin: Negative for rash.    ____________________________________________   PHYSICAL EXAM:  VITAL SIGNS: ED Triage Vitals  Enc Vitals Group     BP 04/26/17 0742 (!) 132/92     Pulse Rate 04/26/17 0742 67     Resp --      Temp 04/26/17 0742 97.8 F (36.6 C)     Temp Source 04/26/17 0742 Oral     SpO2 04/26/17 0742 100 %     Weight 04/26/17 0742 194 lb (88 kg)     Height 04/26/17 0742 5\' 3"  (1.6 m)     Head Circumference --      Peak Flow --      Pain Score 04/26/17 0741 5     Pain Loc --      Pain Edu? --      Excl. in GC? --     Constitutional: Alert and oriented. Well appearing and in no acute distress. Eyes: Conjunctivae are normal.  Head: Atraumatic. Nose: No congestion/rhinnorhea. Mouth/Throat: Mucous membranes are moist.   Cardiovascular: Normal rate, regular rhythm. Respiratory: Normal respiratory effort.  No retractions GU: deferred Musculoskeletal: FROM all extremities, warm and well perfused, left shoulder is tender along the trapezius muscle and the bursa, pain is reproduced with full range of motion, rotator cuff appears to be intact, neurovascular is intact  Neurologic:  Normal speech and language.  Skin:  Skin is warm, dry and intact. No rash noted. Psychiatric: Mood and affect are normal. Speech and behavior are normal.  ____________________________________________   LABS (all labs ordered are listed, but only abnormal results are displayed)  Labs Reviewed - No data to display ____________________________________________   ____________________________________________  RADIOLOGY    ____________________________________________   PROCEDURES  Procedure(s) performed: No      ____________________________________________   INITIAL IMPRESSION / ASSESSMENT AND PLAN / ED COURSE  Pertinent labs & imaging results that were available during my care of the  patient were reviewed by me and considered in my medical decision making (see chart for details).  Patient is a 37 year old female complaining of left shoulder pain, she states she moved furniture last week, pain since then, on physical exam pain is reproduced with all range of motion, there is tenderness along the bursa of the left shoulder in the trapezius muscle, diagnosis is acute muscle strain of the left shoulder, prescription for meloxicam 15 mg daily and baclofen 10 mg 3 times daily was given to the patient, she was instructed to use ice at least 3 times a day to decrease inflammation, she can use wet heat to loosen the muscles and stretch, but she should apply ice afterwards, the patient states she understands, she is to follow-up with orthopedics if she is not better in 1 week, on discharge she states she understands will comply with all instructions, she was discharged in stable condition     As part of my medical decision making, I reviewed the following data within the electronic MEDICAL RECORD NUMBER Reviewed past ED visits____________________________________________   FINAL CLINICAL IMPRESSION(S) / ED DIAGNOSES  Final diagnoses:  Strain of left shoulder, initial encounter      NEW MEDICATIONS STARTED DURING THIS VISIT:  This SmartLink is deprecated. Use AVSMEDLIST instead to display the medication list for a patient.   Note:  This document was prepared using Dragon voice recognition software and may include unintentional dictation errors.    Faythe GheeFisher, Jermal Dismuke W, PA-C 04/26/17 0809    Emily FilbertWilliams, Jonathan E, MD 04/26/17 [phone]

## 2017-04-26 NOTE — Discharge Instructions (Signed)
Follow-up with your regular doctor or the orthopedic doctor if you are not better in 5-7 days, use medication as prescribed, apply ice to the left shoulder as often as possible, to loosen the muscle use wet heat and stretch, then apply ice afterwards, if you are worsening please return to the emergency department

## 2017-06-16 ENCOUNTER — Emergency Department
Admission: EM | Admit: 2017-06-16 | Discharge: 2017-06-16 | Disposition: A | Discharge status: Home / Self Care | Payer: Medicaid Other | Attending: Emergency Medicine | Admitting: Emergency Medicine

## 2017-06-16 ENCOUNTER — Other Ambulatory Visit: Payer: Self-pay

## 2017-06-16 ENCOUNTER — Encounter: Payer: Self-pay | Admitting: Emergency Medicine

## 2017-06-16 DIAGNOSIS — R51 Headache: Secondary | ICD-10-CM | POA: Diagnosis present

## 2017-06-16 DIAGNOSIS — I10 Essential (primary) hypertension: Secondary | ICD-10-CM | POA: Diagnosis not present

## 2017-06-16 DIAGNOSIS — F17200 Nicotine dependence, unspecified, uncomplicated: Secondary | ICD-10-CM | POA: Insufficient documentation

## 2017-06-16 DIAGNOSIS — G43001 Migraine without aura, not intractable, with status migrainosus: Secondary | ICD-10-CM

## 2017-06-16 MED ORDER — ONDANSETRON HCL 4 MG/2ML IJ SOLN
4.0000 mg | Freq: Once | INTRAMUSCULAR | Status: AC
  Administered 2017-06-16: 4 mg via INTRAVENOUS
  Filled 2017-06-16: qty 2

## 2017-06-16 MED ORDER — KETOROLAC TROMETHAMINE 30 MG/ML IJ SOLN
30.0000 mg | Freq: Once | INTRAMUSCULAR | Status: AC
  Administered 2017-06-16: 30 mg via INTRAVENOUS
  Filled 2017-06-16: qty 1

## 2017-06-16 MED ORDER — SODIUM CHLORIDE 0.9 % IV BOLUS (SEPSIS)
1000.0000 mL | Freq: Once | INTRAVENOUS | Status: AC
  Administered 2017-06-16: 1000 mL via INTRAVENOUS

## 2017-06-16 NOTE — Discharge Instructions (Signed)
Drink plenty of fluids to help decrease her headaches.  Take over-the-counter medications as needed.  Return to the emergency department if you are worsening

## 2017-06-16 NOTE — ED Triage Notes (Signed)
Patient ambulatory to triage with steady gait, without difficulty or distress noted; pt reports migraine "behind eyes" x 2 days with no accomp symptoms; st hx of same

## 2017-06-16 NOTE — ED Notes (Signed)

## 2017-06-16 NOTE — ED Provider Notes (Signed)
[facility] Emergency Department Provider Note  ____________________________________________   First MD Initiated Contact with Patient 06/16/17 2009     (approximate)  I have reviewed the triage vital signs and the nursing notes.   HISTORY  Chief Complaint Migraine    HPI Nicole Rowe is a 37 y.o. female presents to the emergency department complaining of a migraine.  She states she has been drinking a lot of [facility] and is wondering if it is a caffeine headache.  She does not drink water or any other fluids.  She states she has had the headache for at least 2 days.  She denies any nausea or vomiting.  She denies any sensitivity to light.  She states she is sensitive to noise.  She states the headaches are typical of her migraines that she has had in the past.  Past Medical History:  Diagnosis Date  . Hypertension   . Migraine   . Syncope and collapse     Patient Active Problem List   Diagnosis Date Noted  . Pain in the chest 01/11/2014    Past Surgical History:  Procedure Laterality Date  . TUBAL LIGATION    . TYMPANOSTOMY TUBE PLACEMENT      Prior to Admission medications   Medication Sig Start Date End Date Taking? Authorizing Provider  baclofen (LIORESAL) 10 MG tablet Take 1 tablet (10 mg total) by mouth daily. 04/26/17 04/26/18  Ygnacio Fecteau, Roselyn BeringSusan W, PA-C  meloxicam (MOBIC) 15 MG tablet Take 1 tablet (15 mg total) by mouth daily. 04/26/17 04/26/18  Faythe GheeFisher, Tabetha Haraway W, PA-C    Allergies Penicillins  Family History  Problem Relation Age of Onset  . Arrhythmia Mother   . Hyperlipidemia Mother   . Heart attack Father   . Stroke Father   . Hypertension Father   . Hyperlipidemia Father     Social History Social History   Tobacco Use  . Smoking status: Current Some Day Smoker  . Smokeless tobacco: Never Used  Substance Use Topics  . Alcohol use: No  . Drug use: No    Review of Systems  Constitutional: No fever/chills.  Positive for  headache Eyes: No visual changes. ENT: No sore throat. Respiratory: Denies cough Gastrointestinal: Denies vomiting or diarrhea Genitourinary: Negative for dysuria. Musculoskeletal: Negative for back pain. Skin: Negative for rash.    ____________________________________________   PHYSICAL EXAM:  VITAL SIGNS: ED Triage Vitals  Enc Vitals Group     BP 06/16/17 1914 (!) 141/84     Pulse Rate 06/16/17 1914 74     Resp 06/16/17 1914 18     Temp 06/16/17 1914 98.4 F (36.9 C)     Temp Source 06/16/17 1914 Oral     SpO2 06/16/17 1914 99 %     Weight 06/16/17 1913 164 lb (74.4 kg)     Height 06/16/17 1913 5\' 3"  (1.6 m)     Head Circumference --      Peak Flow --      Pain Score 06/16/17 1913 6     Pain Loc --      Pain Edu? --      Excl. in GC? --     Constitutional: Alert and oriented. Well appearing and in no acute distress.  Walks to the exam room and a steady gait.  The lites are completely on, the children are playing their iPods loudly, and they are all laughing while in the exam Eyes: Conjunctivae are normal. PERRL Head: Atraumatic. Nose:  No congestion/rhinnorhea. Mouth/Throat: Mucous membranes are moist.   Cardiovascular: Normal rate, regular rhythm.  Heart sounds are normal Respiratory: Normal respiratory effort.  No retractions, lungs clear to auscultation GU: deferred Musculoskeletal: FROM all extremities, warm and well perfused Neurologic:  Normal speech and language.  Cranial nerves II through XII are grossly intact Skin:  Skin is warm, dry and intact. No rash noted. Psychiatric: Mood and affect are normal. Speech and behavior are normal.  ____________________________________________   LABS (all labs ordered are listed, but only abnormal results are displayed)  Labs Reviewed - No data to  display ____________________________________________   ____________________________________________  RADIOLOGY    ____________________________________________   PROCEDURES  Procedure(s) performed: Saline lock, normal saline 1 L bolus IV, Toradol 30 mg IV, Zofran 4 mg IV  Procedures    ____________________________________________   INITIAL IMPRESSION / ASSESSMENT AND PLAN / ED COURSE  Pertinent labs & imaging results that were available during my care of the patient were reviewed by me and considered in my medical decision making (see chart for details).  Patient is 37 year old female presented to the emergency department complaining of a migraine  On physical exam she appears very well.  She did not seem to be bothered by the lights and the noise the children are making in the room.  Her exam is benign  Patient was given normal saline 1 L bolus IV, Toradol 30 mg IV, Zofran 4 mg IV  After the bag of fluid the patient states she felt much better.  She was instructed to continue to drink fluids that she may have actually become a little dehydrated which is causing her migraines.  She states she understands.  She will take over-the-counter pain medicines as needed for headache as needed.  She is to return to the emergency department if worsening.  Patient states she understands that she was discharged in stable condition.     As part of my medical decision making, I reviewed the following data within the electronic MEDICAL RECORD NUMBER Nursing notes reviewed and incorporated, Old chart reviewed, Notes from prior ED visits and [place] Controlled Substance Database  ____________________________________________   FINAL CLINICAL IMPRESSION(S) / ED DIAGNOSES  Final diagnoses:  Migraine without aura and with status migrainosus, not intractable      NEW MEDICATIONS STARTED DURING THIS VISIT:  Discharge Medication List as of 06/16/2017  9:01 PM       Note:  This document was  prepared using Dragon voice recognition software and may include unintentional dictation errors.    Faythe Ghee, PA-C 06/17/17 0006    Phineas Semen, MD 06/18/17 [phone]

## 2017-08-17 ENCOUNTER — Other Ambulatory Visit: Payer: Self-pay

## 2017-08-17 ENCOUNTER — Emergency Department (HOSPITAL_COMMUNITY)
Admission: EM | Admit: 2017-08-17 | Discharge: 2017-08-17 | Disposition: A | Discharge status: Home / Self Care | Payer: Medicaid Other | Attending: Emergency Medicine | Admitting: Emergency Medicine

## 2017-08-17 ENCOUNTER — Emergency Department (HOSPITAL_COMMUNITY): Payer: Medicaid Other

## 2017-08-17 DIAGNOSIS — M6289 Other specified disorders of muscle: Secondary | ICD-10-CM

## 2017-08-17 DIAGNOSIS — F17210 Nicotine dependence, cigarettes, uncomplicated: Secondary | ICD-10-CM | POA: Diagnosis not present

## 2017-08-17 DIAGNOSIS — I10 Essential (primary) hypertension: Secondary | ICD-10-CM | POA: Diagnosis not present

## 2017-08-17 DIAGNOSIS — M791 Myalgia, unspecified site: Secondary | ICD-10-CM | POA: Diagnosis not present

## 2017-08-17 DIAGNOSIS — M25512 Pain in left shoulder: Secondary | ICD-10-CM | POA: Diagnosis not present

## 2017-08-17 LAB — I-STAT BETA HCG BLOOD, ED (MC, WL, AP ONLY): I-stat hCG, quantitative: <5 m[IU]/mL (ref <5)

## 2017-08-17 LAB — CBC
HCT: 40.9 % (ref 36.0–46.0)
HEMOGLOBIN: 13.4 g/dL (ref 12.0–15.0)
MCH: 28.5 pg (ref 26.0–34.0)
MCHC: 32.8 g/dL (ref 30.0–36.0)
MCV: 86.8 fL (ref 78.0–100.0)
Platelets: 285 10*3/uL (ref 150–400)
RBC: 4.71 MIL/uL (ref 3.87–5.11)
RDW: 13.2 % (ref 11.5–15.5)
WBC: 10.4 10*3/uL (ref 4.0–10.5)

## 2017-08-17 LAB — I-STAT TROPONIN, ED: Troponin i, poc: 0 ng/mL (ref 0.00–0.08)

## 2017-08-17 LAB — BASIC METABOLIC PANEL
ANION GAP: 9 (ref 5–15)
BUN: 5 mg/dL — ABNORMAL LOW (ref 6–20)
CHLORIDE: 107 mmol/L (ref 101–111)
CO2: 25 mmol/L (ref 22–32)
CREATININE: 0.58 mg/dL (ref 0.44–1.00)
Calcium: 9.1 mg/dL (ref 8.9–10.3)
GFR calc non Af Amer: >60 mL/min (ref >60)
Glucose, Bld: 129 mg/dL — ABNORMAL HIGH (ref 65–99)
Potassium: 3.7 mmol/L (ref 3.5–5.1)
Sodium: 141 mmol/L (ref 135–145)

## 2017-08-17 MED ORDER — KETOROLAC TROMETHAMINE 30 MG/ML IJ SOLN
30.0000 mg | Freq: Once | INTRAMUSCULAR | Status: AC
  Administered 2017-08-17: 30 mg via INTRAMUSCULAR
  Filled 2017-08-17: qty 1

## 2017-08-17 MED ORDER — IBUPROFEN 800 MG PO TABS: 800.0000 mg | ORAL_TABLET | Freq: Three times a day (TID) | ORAL | 0 refills | Status: DC

## 2017-08-17 MED ORDER — CYCLOBENZAPRINE HCL 10 MG PO TABS
10.0000 mg | ORAL_TABLET | Freq: Once | ORAL | Status: AC
  Administered 2017-08-17: 10 mg via ORAL
  Filled 2017-08-17: qty 1

## 2017-08-17 MED ORDER — CYCLOBENZAPRINE HCL 10 MG PO TABS: 10.0000 mg | ORAL_TABLET | Freq: Two times a day (BID) | ORAL | 0 refills | Status: DC | PRN

## 2017-08-17 NOTE — ED Provider Notes (Signed)
Woke up Patient placed in Quick Look pathway, seen and evaluated   Chief Complaint: left arm pain  HPI:  Patient presents with sudden left shoulder/arm pain upon awakening this morning, pain shooting down the left arm. Pain has been constant and unchanged since this am. She was driving when she became sweaty and stopped to call her husband to take her here. Nothing tried for pain prior to arrival. No trauma or injury. increased emotional stress later, no weakness or numbness. No DVT/PE, estrogen use, LE pain or edema, prolonged immobilization, (+) cervical cancer remission for 14 years.  ROS: no chest pain, sob, nausea, vomiting  Physical Exam:   Gen: No distress  Neuro: Awake and Alert  Skin: Warm    Focused Exam:  No LE edema, lungs cta bilaterally. (+) hawkins and empty can tests. Strong radial pulses. NVI   Initiation of care has begun. The patient has been counseled on the process, plan, and necessity for staying for the completion/evaluation, and the remainder of the medical screening examination    Gregary Cromer 08/17/17 2018    Alvira Monday, MD 08/20/17 1241

## 2017-08-17 NOTE — ED Triage Notes (Signed)
Patient awoke and felt left sided CP with left arm pain. Denies SOB.

## 2017-08-17 NOTE — ED Provider Notes (Signed)
[name] EMERGENCY DEPARTMENT Provider Note   CSN: [id number] Arrival date & time: 08/17/17  1954     History   Chief Complaint Chief Complaint  Patient presents with  . Chest Pain    Left side    HPI Nicole Rowe is a 37 y.o. female.  Patient presents to the emergency department with a chief complaint of left shoulder pain.  She states that she awoke with the pain this morning.  She states that it is worsened with movement and palpation.  She also states that she had some chest pain a few days ago, but thinks this is secondary to her anxiety after the recent loss of her father.  She states that the pain radiates down her left arm.  She reports the pain is moderate to severe.  She has not taken anything for the symptoms.  The history is provided by the patient. No language interpreter was used.    Past Medical History:  Diagnosis Date  . Hypertension   . Migraine   . Syncope and collapse     Patient Active Problem List   Diagnosis Date Noted  . Pain in the chest 01/11/2014    Past Surgical History:  Procedure Laterality Date  . TUBAL LIGATION    . TYMPANOSTOMY TUBE PLACEMENT       OB History   None      Home Medications    Prior to Admission medications   Medication Sig Start Date End Date Taking? Authorizing Provider  baclofen (LIORESAL) 10 MG tablet Take 1 tablet (10 mg total) by mouth daily. 04/26/17 04/26/18  Fisher, Roselyn Bering, PA-C  meloxicam (MOBIC) 15 MG tablet Take 1 tablet (15 mg total) by mouth daily. 04/26/17 04/26/18  Faythe Ghee, PA-C    Family History Family History  Problem Relation Age of Onset  . Arrhythmia Mother   . Hyperlipidemia Mother   . Heart attack Father   . Stroke Father   . Hypertension Father   . Hyperlipidemia Father     Social History Social History   Tobacco Use  . Smoking status: Current Some Day Smoker  . Smokeless tobacco: Never Used  Substance Use Topics  . Alcohol use: No  . Drug use: No      Allergies   Penicillins   Review of Systems Review of Systems  All other systems reviewed and are negative.    Physical Exam Updated Vital Signs BP (!) 157/113   Pulse 68   Temp 98.9 F (37.2 C) (Oral)   Resp 18   Ht  (1.6 m)   Wt 81.2 kg (179 lb)   LMP 08/05/2017   SpO2 100%   BMI 31.71 kg/m   Physical Exam  Constitutional: She is oriented to person, place, and time. She appears well-developed and well-nourished.  HENT:  Head: Normocephalic and atraumatic.  Eyes: Pupils are equal, round, and reactive to light. Conjunctivae and EOM are normal.  Neck: Normal range of motion. Neck supple.  Cardiovascular: Normal rate and regular rhythm. Exam reveals no gallop and no friction rub.  No murmur heard. Pulmonary/Chest: Effort normal and breath sounds normal. No respiratory distress. She has no wheezes. She has no rales. She exhibits no tenderness.  Abdominal: Soft. Bowel sounds are normal. She exhibits no distension and no mass. There is no tenderness. There is no rebound and no guarding.  Musculoskeletal: Normal range of motion. She exhibits no edema or tenderness.  Left upper trapezius tender to  palpation  Neurological: She is alert and oriented to person, place, and time.  Skin: Skin is warm and dry.  Psychiatric: She has a normal mood and affect. Her behavior is normal. Judgment and thought content normal.  Nursing note and vitals reviewed.    ED Treatments / Results  Labs (all labs ordered are listed, but only abnormal results are displayed) Labs Reviewed  BASIC METABOLIC PANEL - Abnormal; Notable for the following components:      Result Value   Glucose, Bld 129 (*)    BUN 5 (*)    All other components within normal limits  CBC  I-STAT TROPONIN, ED  I-STAT BETA HCG BLOOD, ED ([facility], [facility], [facility] ONLY)    EKG None ED ECG REPORT  I personally interpreted this EKG   Date: 08/17/2017   Rate: 83  Rhythm: normal sinus rhythm  QRS Axis: normal  Intervals:  normal  ST/T Wave abnormalities: normal  Conduction Disutrbances:none  Narrative Interpretation:   Old EKG Reviewed: none available   Radiology Dg Chest 2 View  Result Date: 08/17/2017 CLINICAL DATA:  Left-sided chest pain and left arm pain. History of hypertension and syncope. Occasional smoker. EXAM: CHEST - 2 VIEW COMPARISON:  02/22/2017 FINDINGS: The heart size and mediastinal contours are within normal limits. Both lungs are clear. The visualized skeletal structures are unremarkable. IMPRESSION: No active cardiopulmonary disease. Electronically Signed   By: Burman Nieves M.D.   On: 08/17/2017 21:26    Procedures Procedures (including critical care time)  Medications Ordered in ED Medications  ketorolac (TORADOL) 30 MG/ML injection 30 mg (has no administration in time range)  cyclobenzaprine (FLEXERIL) tablet 10 mg (has no administration in time range)     Initial Impression / Assessment and Plan / ED Course  I have reviewed the triage vital signs and the nursing notes.  Pertinent labs & imaging results that were available during my care of the patient were reviewed by me and considered in my medical decision making (see chart for details).     Patient with left shoulder and upper back pain.  Her left upper trapezius has very tender trigger points and is very tight to palpation.  No central chest pain.  Troponin is normal.  Symptoms are inconsistent with ACS or PE.  CXR is negative.  No ischemic changes on EKG.  Will treat with IM toradol, PO flexeril, and ice.  Recommend stretching, NSAIDs, and ice at home.  Final Clinical Impressions(s) / ED Diagnoses   Final diagnoses:  Muscle tightness  Acute pain of left shoulder    ED Discharge Orders        Ordered    cyclobenzaprine (FLEXERIL) 10 MG tablet  2 times daily PRN     08/17/17 2347    ibuprofen (ADVIL,MOTRIN) 800 MG tablet  3 times daily     08/17/17 2347       Roxy Horseman, PA-C 08/18/17 0002      Ward, Layla Maw, DO 08/18/17 0139

## 2017-09-26 ENCOUNTER — Emergency Department
Admission: EM | Admit: 2017-09-26 | Discharge: 2017-09-26 | Disposition: A | Discharge status: Home / Self Care | Payer: Medicaid Other | Attending: Emergency Medicine | Admitting: Emergency Medicine

## 2017-09-26 ENCOUNTER — Encounter: Payer: Self-pay | Admitting: Emergency Medicine

## 2017-09-26 ENCOUNTER — Other Ambulatory Visit: Payer: Self-pay

## 2017-09-26 DIAGNOSIS — R200 Anesthesia of skin: Secondary | ICD-10-CM | POA: Insufficient documentation

## 2017-09-26 DIAGNOSIS — Z5321 Procedure and treatment not carried out due to patient leaving prior to being seen by health care provider: Secondary | ICD-10-CM | POA: Diagnosis not present

## 2017-09-26 LAB — CBC
HCT: 38.2 % (ref 35.0–47.0)
HEMOGLOBIN: 13 g/dL (ref 12.0–16.0)
MCH: 28.9 pg (ref 26.0–34.0)
MCHC: 34 g/dL (ref 32.0–36.0)
MCV: 84.9 fL (ref 80.0–100.0)
Platelets: 274 10*3/uL (ref 150–440)
RBC: 4.5 MIL/uL (ref 3.80–5.20)
RDW: 13.6 % (ref 11.5–14.5)
WBC: 7.8 10*3/uL (ref 3.6–11.0)

## 2017-09-26 LAB — COMPREHENSIVE METABOLIC PANEL
ALBUMIN: 4.1 g/dL (ref 3.5–5.0)
ALK PHOS: 40 U/L (ref 38–126)
ALT: 17 U/L (ref 14–54)
ANION GAP: 7 (ref 5–15)
AST: 15 U/L (ref 15–41)
BUN: 16 mg/dL (ref 6–20)
CALCIUM: 8.8 mg/dL — AB (ref 8.9–10.3)
CO2: 27 mmol/L (ref 22–32)
Chloride: 104 mmol/L (ref 101–111)
Creatinine, Ser: 0.57 mg/dL (ref 0.44–1.00)
GFR calc Af Amer: >60 mL/min (ref >60)
GLUCOSE: 94 mg/dL (ref 65–99)
POTASSIUM: 3.4 mmol/L — AB (ref 3.5–5.1)
Sodium: 138 mmol/L (ref 135–145)
TOTAL PROTEIN: 7.1 g/dL (ref 6.5–8.1)
Total Bilirubin: 0.8 mg/dL (ref 0.3–1.2)

## 2017-09-26 LAB — TROPONIN I

## 2017-09-26 LAB — POCT PREGNANCY, URINE: Preg Test, Ur: NEGATIVE

## 2017-09-26 NOTE — ED Triage Notes (Signed)
Called for room, not in waiting room.  

## 2017-09-26 NOTE — ED Triage Notes (Signed)
L arm numbness x 2 weeks. Smile symmetrical, grips and leg strength equal. Denies neck pain. States numbness is intermittent.

## 2018-11-17 ENCOUNTER — Other Ambulatory Visit: Payer: Self-pay

## 2018-11-17 ENCOUNTER — Ambulatory Visit: Payer: Self-pay

## 2018-11-17 ENCOUNTER — Ambulatory Visit
Admission: EM | Admit: 2018-11-17 | Discharge: 2018-11-17 | Disposition: A | Discharge status: Home / Self Care | Payer: Self-pay | Attending: Urgent Care | Admitting: Urgent Care

## 2018-11-17 DIAGNOSIS — M25571 Pain in right ankle and joints of right foot: Secondary | ICD-10-CM | POA: Insufficient documentation

## 2018-11-17 DIAGNOSIS — I10 Essential (primary) hypertension: Secondary | ICD-10-CM | POA: Insufficient documentation

## 2018-11-17 DIAGNOSIS — Z87891 Personal history of nicotine dependence: Secondary | ICD-10-CM | POA: Insufficient documentation

## 2018-11-17 DIAGNOSIS — W208XXA Other cause of strike by thrown, projected or falling object, initial encounter: Secondary | ICD-10-CM

## 2018-11-17 DIAGNOSIS — Y929 Unspecified place or not applicable: Secondary | ICD-10-CM | POA: Insufficient documentation

## 2018-11-17 DIAGNOSIS — W19XXXA Unspecified fall, initial encounter: Secondary | ICD-10-CM

## 2018-11-17 DIAGNOSIS — Y939 Activity, unspecified: Secondary | ICD-10-CM | POA: Insufficient documentation

## 2018-11-17 DIAGNOSIS — M79671 Pain in right foot: Secondary | ICD-10-CM | POA: Insufficient documentation

## 2018-11-17 DIAGNOSIS — S9031XA Contusion of right foot, initial encounter: Secondary | ICD-10-CM | POA: Insufficient documentation

## 2018-11-17 MED ORDER — IBUPROFEN 800 MG PO TABS: 800.0000 mg | ORAL_TABLET | Freq: Three times a day (TID) | ORAL | 0 refills | Status: DC | PRN

## 2018-11-17 NOTE — ED Provider Notes (Signed)
[place], [place]   Name: Nicole Rowe DOB: 01/27/1981 MRN: [id number] CSN: [id number] PCP: Hillery AldoPatel, Sarah, MD  Arrival date and time:  11/17/18 1026  Chief Complaint:  Foot Injury   NOTE: Prior to seeing the patient today, I have reviewed the triage nursing documentation and vital signs. Clinical staff has updated patient's PMH/PSHx, current medication list, and drug allergies/intolerances to ensure comprehensive history available to assist in medical decision making.   History:   HPI: Nicole Rowe is a 38 y.o. female who presents today with complaints of RIGHT ankle pain. Patient reports that she was performing her normal job duties today at [employer] when the injury occurred. She advises that she a metal rod fell and struck her RIGHT foot on Wednesday (11/15/2018). Patient finished her shift. Pain began to worsen yesterday. She advises there is pain "shooting" through her foot. Patient has had previous injuries to her RIGHT foot/ankle; has never required surgical intervention. Despite her symptoms, patient has not taken any over the counter interventions to help improve/relieve her reported symptoms at home.   Past Medical History:  Diagnosis Date  . Hypertension   . Migraine   . Syncope and collapse     Past Surgical History:  Procedure Laterality Date  . TUBAL LIGATION    . TYMPANOSTOMY TUBE PLACEMENT      Family History  Problem Relation Age of Onset  . Arrhythmia Mother   . Hyperlipidemia Mother   . Heart attack Father   . Stroke Father   . Hypertension Father   . Hyperlipidemia Father     Social History   Tobacco Use  . Smoking status: Former Games developermoker  . Smokeless tobacco: Never Used  Substance Use Topics  . Alcohol use: No  . Drug use: No    Patient Active Problem List   Diagnosis Date Noted  . Pain in the chest 01/11/2014    Home Medications:    No outpatient medications have been marked as taking for the 11/17/18 encounter [facility](Hospital Encounter).     Allergies:   Penicillins  Review of Systems (ROS): Review of Systems  Constitutional: Negative for chills and fever.  Respiratory: Negative for cough and shortness of breath.   Cardiovascular: Negative for chest pain and palpitations.  Musculoskeletal:       Acute RIGHT foot/ankle pain  Skin: Negative for color change.  All other systems reviewed and are negative.    Vital Signs: Today's Vitals   11/17/18 1042 11/17/18 1043 11/17/18 1149  BP: (!) 133/96    Pulse: 84    Resp: 16    Temp: 98.2 F (36.8 C)    TempSrc: Oral    SpO2: 100%    Weight:  170 lb (77.1 kg)   Height:  5\' 3"  (1.6 m)   PainSc:  5  5     Physical Exam: Physical Exam  Constitutional: She is oriented to person, place, and time and well-developed, well-nourished, and in no distress.  HENT:  Head: Normocephalic and atraumatic.  Mouth/Throat: Mucous membranes are normal.  Eyes: Pupils are equal, round, and reactive to light. EOM are normal.  Cardiovascular: Normal rate, regular rhythm, normal heart sounds and intact distal pulses. Exam reveals no gallop and no friction rub.  No murmur heard. Pulmonary/Chest: Effort normal and breath sounds normal. No respiratory distress. She has no wheezes. She has no rales.  Musculoskeletal:     Right ankle: She exhibits swelling. She exhibits normal range of motion, no ecchymosis, no deformity, no laceration  and normal pulse. Tenderness (to marked areas).       Feet:  Neurological: She is alert and oriented to person, place, and time. Gait normal. GCS score is 15.  Skin: Skin is warm and dry. No rash noted.  Psychiatric: Mood, memory, affect and judgment normal.  Nursing note and vitals reviewed.   Urgent Care Treatments / Results:   LABS: PLEASE NOTE: all labs that were ordered this encounter are listed, however only abnormal results are displayed. Labs Reviewed - No data to display  EKG: -None  RADIOLOGY: Dg Ankle Complete Right  Result Date:  11/17/2018 CLINICAL DATA:  Status post fall, lateral ankle pain EXAM: RIGHT ANKLE - COMPLETE 3+ VIEW COMPARISON:  None. FINDINGS: There is no evidence of fracture, dislocation, or joint effusion. There is a plantar calcaneal spur. Ankle mortise is intact. Soft tissues are unremarkable. IMPRESSION: No acute osseous injury of the right ankle. Electronically Signed   By: Kathreen Devoid   On: 11/17/2018 11:19    PROCEDURES: Procedures  MEDICATIONS RECEIVED THIS VISIT: Medications - No data to display  PERTINENT CLINICAL COURSE NOTES/UPDATES:   Initial Impression / Assessment and Plan / Urgent Care Course:  Pertinent labs & imaging results that were available during my care of the patient were personally reviewed by me and considered in my medical decision making (see lab/imaging section of note for values and interpretations).  Nicole Rowe is a 37 y.o. female who presents to [facility] today with complaints of Foot Injury   Patient is well appearing overall in clinic today. She does not appear to be in any acute distress. Presenting symptoms (see HPI) and exam as documented above. Diagnostic radiographs of the RIGHT ankle negative for acute fracture or dislocation. Patient requesting compression wrap for comfort; ACE wrap placed.  Patient to use IBU on a PRN basis for pain. Encouraged complimentary treatment using rest, ice, and elevation to help with pain and swelling.   Patient needs to be seen for return to work evaluation by occupational medicine. Name and office contact information provided on today's AVS for [facility], NP. Patient advised the she will need to contact the office to schedule an appointment to be seen.   I have reviewed the follow up and strict return precautions for any new or worsening symptoms. Patient is aware of symptoms that would be deemed urgent/emergent, and would thus require further evaluation either here or in the emergency department. At the time of discharge,  she verbalized understanding and consent with the discharge plan as it was reviewed with her. All questions were fielded by provider and/or clinic staff prior to patient discharge.    Final Clinical Impressions / Urgent Care Diagnoses:   Final diagnoses:  Contusion of right foot, initial encounter    New Prescriptions:  [place] Controlled Substance Registry consulted? Not Applicable  Meds ordered this encounter  Medications  . ibuprofen (ADVIL) 800 MG tablet    Sig: Take 1 tablet (800 mg total) by mouth every 8 (eight) hours as needed.    Dispense:  21 tablet    Refill:  0    Recommended Follow up Care:  Patient encouraged to follow up with the following provider within the specified time frame, or sooner as dictated by the severity of her symptoms. As always, she was instructed that for any urgent/emergent care needs, she should seek care either here or in the emergency department for more immediate evaluation.  Follow-up Information    Call  Milagros Evener  D, FNP.   Specialty: Family Medicine Why: Will need to follow up with this provider for return to full duty clearance. Contact information: [address] [place] [address] [phone]         NOTE: This note was prepared using Dragon dictation software along with smaller phrase technology. Despite my best ability to proofread, there is the potential that transcriptional errors may still occur from this process, and are completely unintentional.     Verlee MonteGray, Shaunta Oncale E, NP 11/17/18 1351

## 2018-11-17 NOTE — ED Triage Notes (Signed)
Pt states while at work Wednesday a metal rod came down on her right foot and has been having pain since. States she works at [employer] in [place].

## 2018-11-17 NOTE — Discharge Instructions (Addendum)
It was very nice seeing you today in clinic. Thank you for entrusting me with your care.   Your xray was negative for fracture. Rest, ice, and elevate foot. May wear ACE wrap for comfort. Please utilize the medications that we discussed. Your prescriptions have been called in to your pharmacy.   Make arrangements to follow up with occupational medicine provider for return to full duty clearance. If your symptoms/condition worsens, please seek follow up care either here or in the ER. Please remember, our [place] providers are "right here with you" when you need [place].   Again, it was my pleasure to take care of you today. Thank you for choosing our clinic. I hope that you start to feel better quickly.   Honor Loh, MSN, APRN, FNP-C, CEN Advanced Practice Provider [facility]

## 2019-01-06 ENCOUNTER — Emergency Department
Admission: EM | Admit: 2019-01-06 | Discharge: 2019-01-06 | Disposition: A | Discharge status: Home / Self Care | Payer: Medicaid Other | Attending: Emergency Medicine | Admitting: Emergency Medicine

## 2019-01-06 ENCOUNTER — Emergency Department: Payer: Medicaid Other

## 2019-01-06 ENCOUNTER — Other Ambulatory Visit: Payer: Self-pay

## 2019-01-06 ENCOUNTER — Encounter: Payer: Self-pay | Admitting: Emergency Medicine

## 2019-01-06 DIAGNOSIS — I10 Essential (primary) hypertension: Secondary | ICD-10-CM | POA: Insufficient documentation

## 2019-01-06 DIAGNOSIS — Z87891 Personal history of nicotine dependence: Secondary | ICD-10-CM | POA: Insufficient documentation

## 2019-01-06 DIAGNOSIS — Z79899 Other long term (current) drug therapy: Secondary | ICD-10-CM | POA: Insufficient documentation

## 2019-01-06 DIAGNOSIS — M7731 Calcaneal spur, right foot: Secondary | ICD-10-CM

## 2019-01-06 DIAGNOSIS — M722 Plantar fascial fibromatosis: Secondary | ICD-10-CM

## 2019-01-06 MED ORDER — TRAMADOL HCL 50 MG PO TABS: 50.0000 mg | ORAL_TABLET | Freq: Four times a day (QID) | ORAL | 0 refills | Status: AC | PRN

## 2019-01-06 MED ORDER — NAPROXEN 500 MG PO TABS: 500.0000 mg | ORAL_TABLET | Freq: Two times a day (BID) | ORAL | Status: DC

## 2019-01-06 NOTE — Discharge Instructions (Signed)
Advised to purchase over-the-counter cushion for heel support pending evaluation by podiatrist.

## 2019-01-06 NOTE — ED Provider Notes (Signed)
[facility] Emergency Department Provider Note  ____________________________________________   None    (approximate)  I have reviewed the triage vital signs and the nursing notes.   HISTORY  Chief Complaint Foot Pain    HPI Nicole Rowe is a 38 y.o. female patient presents with 2 months of diffuse right foot pain.  Patient relates anterior foot injury that was seen by urgent care clinic with no acute findings.  Patient states pain never this severe but worse in the past week.  Patient rates pain as a 5/10.  Patient described the pain is "achy".  Patient the pain increased with ambulation.  No palliative measure for complaint.         Past Medical History:  Diagnosis Date  . Hypertension   . Migraine   . Syncope and collapse     Patient Active Problem List   Diagnosis Date Noted  . Pain in the chest 01/11/2014    Past Surgical History:  Procedure Laterality Date  . TUBAL LIGATION    . TYMPANOSTOMY TUBE PLACEMENT      Prior to Admission medications   Medication Sig Start Date End Date Taking? Authorizing Provider  cyclobenzaprine (FLEXERIL) 10 MG tablet Take 1 tablet (10 mg total) by mouth 2 (two) times daily as needed for muscle spasms. 08/17/17   Montine Circle, PA-C  ibuprofen (ADVIL) 800 MG tablet Take 1 tablet (800 mg total) by mouth every 8 (eight) hours as needed. 11/17/18   Karen Kitchens, NP  naproxen (NAPROSYN) 500 MG tablet Take 1 tablet (500 mg total) by mouth 2 (two) times daily with a meal. 01/06/19   Sable Feil, PA-C  traMADol (ULTRAM) 50 MG tablet Take 1 tablet (50 mg total) by mouth every 6 (six) hours as needed for up to 3 days. 01/06/19 01/09/19  Sable Feil, PA-C    Allergies Penicillins  Family History  Problem Relation Age of Onset  . Arrhythmia Mother   . Hyperlipidemia Mother   . Heart attack Father   . Stroke Father   . Hypertension Father   . Hyperlipidemia Father     Social History Social History    Tobacco Use  . Smoking status: Former Research scientist (life sciences)  . Smokeless tobacco: Never Used  Substance Use Topics  . Alcohol use: No  . Drug use: No    Review of Systems Constitutional: No fever/chills Eyes: No visual changes. ENT: No sore throat. Cardiovascular: Denies chest pain. Respiratory: Denies shortness of breath. Gastrointestinal: No abdominal pain.  No nausea, no vomiting.  No diarrhea.  No constipation. Genitourinary: Negative for dysuria. Musculoskeletal: Right foot pain. Skin: Negative for rash. Neurological: Negative for headaches, focal weakness or numbness. Endocrine:  Hypertension  ____________________________________________   PHYSICAL EXAM:  VITAL SIGNS: ED Triage Vitals  Enc Vitals Group     BP 01/06/19 0848 (!) 144/112     Pulse Rate 01/06/19 0848 71     Resp --      Temp 01/06/19 0848 98.2 F (36.8 C)     Temp Source 01/06/19 0848 Oral     SpO2 01/06/19 0848 100 %     Weight 01/06/19 0852 174 lb (78.9 kg)     Height 01/06/19 0852 5\' 3"  (1.6 m)     Head Circumference --      Peak Flow --      Pain Score 01/06/19 0852 5     Pain Loc --      Pain Edu? --  Excl. in GC? --    Constitutional: Alert and oriented. Well appearing and in no acute distress. Cardiovascular: Normal rate, regular rhythm. Grossly normal heart sounds.  Good peripheral circulation.  Elevated blood pressure. Respiratory: Normal respiratory effort.  No retractions. Lungs CTAB. Musculoskeletal: No obvious deformity, edema,or erythema.  Moderate guarding palpation of plantar and dorsal aspect of right foot.   Neurologic:  Normal speech and language. No gross focal neurologic deficits are appreciated. No gait instability. Skin:  Skin is warm, dry and intact. No rash noted.  No abrasions or ecchymosis. Psychiatric: Mood and affect are normal. Speech and behavior are normal.  ____________________________________________   LABS (all labs ordered are listed, but only abnormal results are  displayed)  Labs Reviewed - No data to display ____________________________________________  EKG   ____________________________________________  RADIOLOGY  ED MD interpretation:    Official radiology report(s): Dg Foot Complete Right  Result Date: 01/06/2019 CLINICAL DATA:  Increasing foot pain, history of trauma, pain the ankle and heel EXAM: RIGHT FOOT COMPLETE - 3+ VIEW COMPARISON:  Right ankle radiographs, 11/17/2018 FINDINGS: No fracture or dislocation of the right foot. Joint spaces are well preserved. Moderate plantar calcaneal spur. Soft tissues are unremarkable. IMPRESSION: 1.  No fracture or dislocation of the right foot. 2.  Moderate plantar calcaneal spur, which may be symptomatic. Electronically Signed   By: Lauralyn PrimesAlex  Bibbey M.D.   On: 01/06/2019 10:10    ____________________________________________   PROCEDURES  Procedure(s) performed (including Critical Care):  Procedures   ____________________________________________   INITIAL IMPRESSION / ASSESSMENT AND PLAN / ED COURSE  As part of my medical decision making, I reviewed the following data within the electronic MEDICAL RECORD NUMBER         Nicole Rowe was evaluated in Emergency Department on 01/06/2019 for the symptoms described in the history of present illness. She was evaluated in the context of the global COVID-19 pandemic, which necessitated consideration that the patient might be at risk for infection with the SARS-CoV-2 virus that causes COVID-19. Institutional protocols and algorithms that pertain to the evaluation of patients at risk for COVID-19 are in a state of rapid change based on information released by regulatory bodies including the CDC and federal and state organizations. These policies and algorithms were followed during the patient's care in the ED.    Patient presents with diffuse foot pain plantar aspect of the right foot.  Discussed x-ray findings with patient.  Patient physical exam  consistent with plantar fasciitis.  Patient given a consult to podiatry.   ____________________________________________   FINAL CLINICAL IMPRESSION(S) / ED DIAGNOSES  Final diagnoses:  Plantar fasciitis of right foot  Heel spur, right     ED Discharge Orders         Ordered    naproxen (NAPROSYN) 500 MG tablet  2 times daily with meals     01/06/19 1026    traMADol (ULTRAM) 50 MG tablet  Every 6 hours PRN     01/06/19 1026           Note:  This document was prepared using Dragon voice recognition software and may include unintentional dictation errors.    Joni ReiningSmith, Yaritza Leist K, PA-C 01/06/19 1030    Sharman CheekStafford, Phillip, MD 01/07/19 [phone]

## 2019-01-06 NOTE — ED Triage Notes (Signed)
Pt c/o right foot pain, states its painful to walk, pt declined wheelchair. Pt walking with a limp.

## 2019-02-23 IMAGING — CR DG CHEST 2V
1 series · 2 of 2 positions shown · non-contrast
Comparison: 11/13/2014

CLINICAL DATA: Hypertension for 1 day.

EXAM:
CHEST  2 VIEW

[Series 1: dg chest 2 view · 0.14mm/px · 2 of 2 slices shown]
[im 1/2]
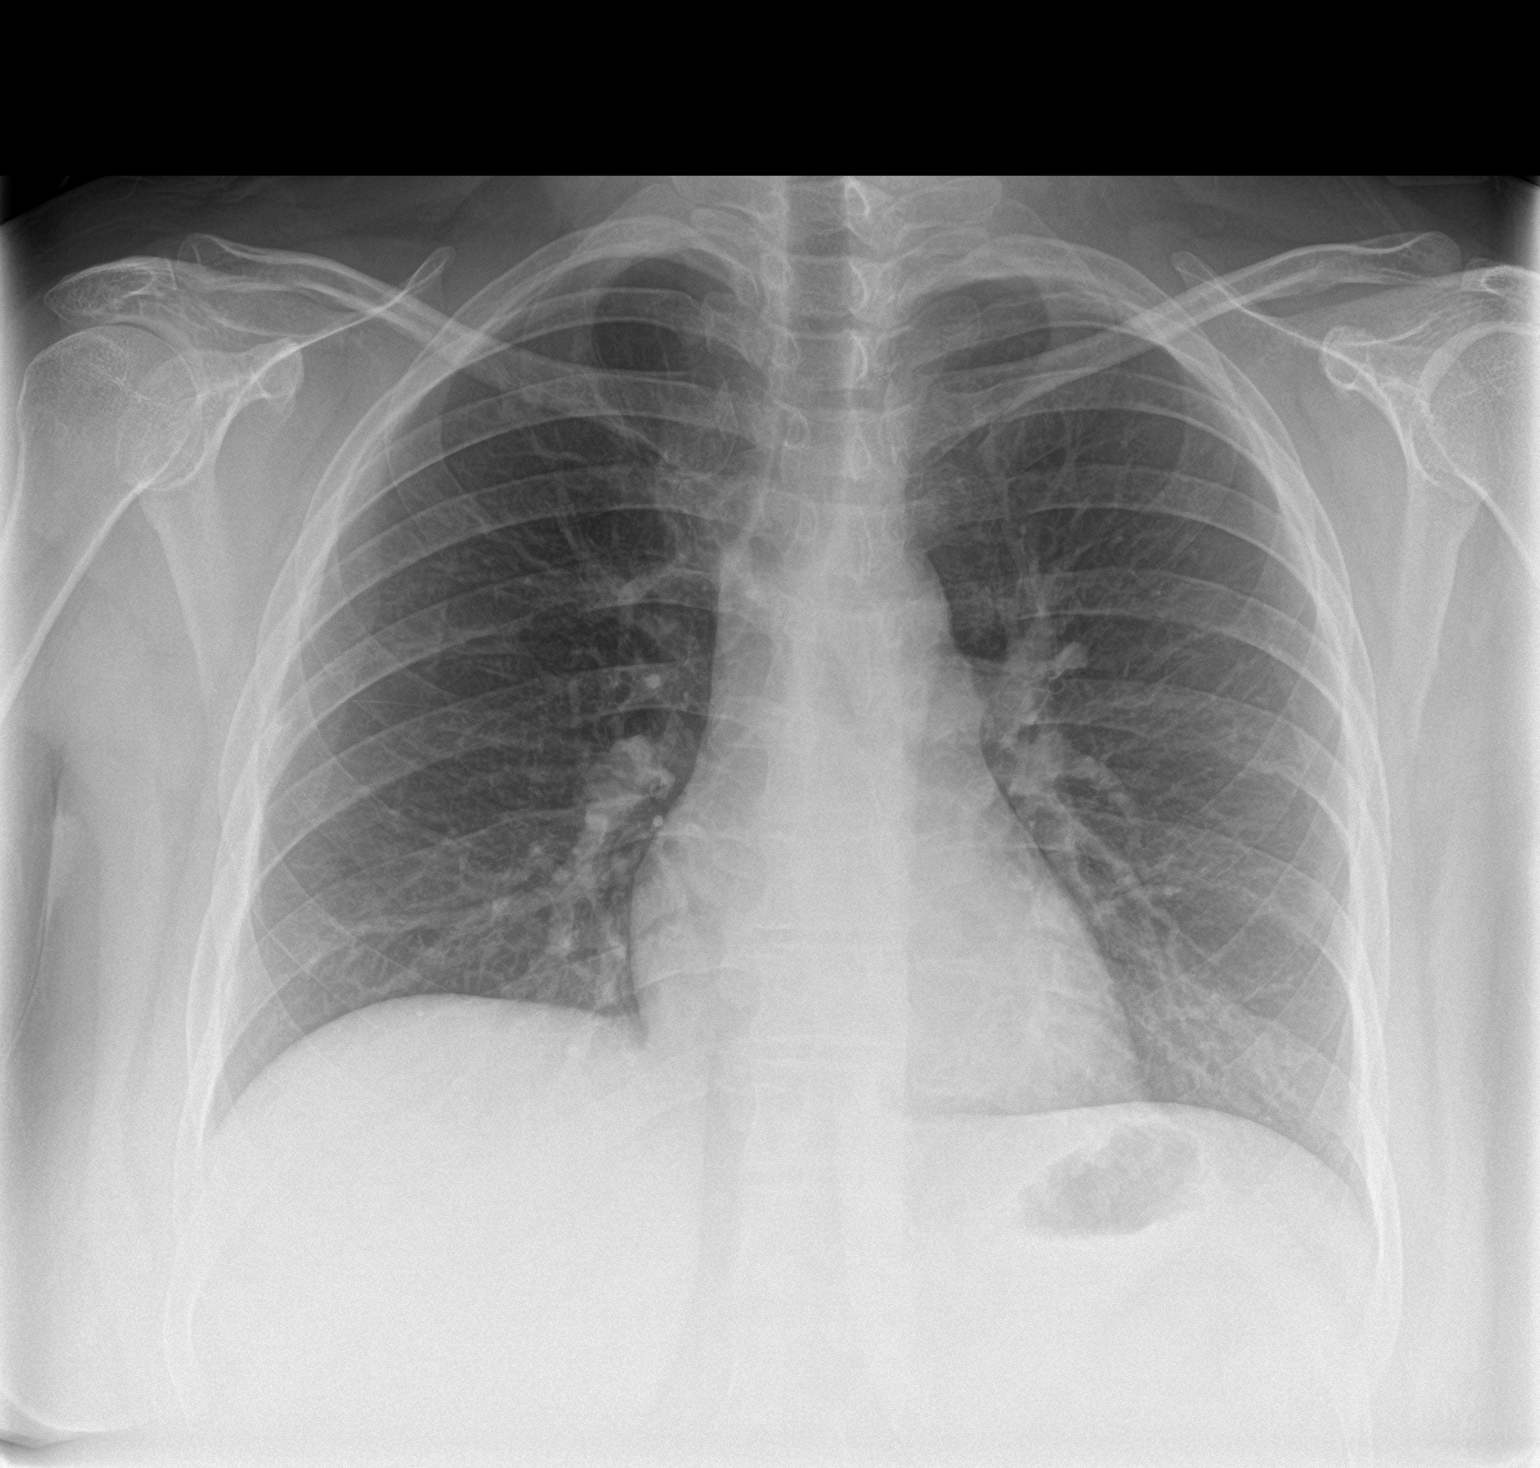
[im 2/2]
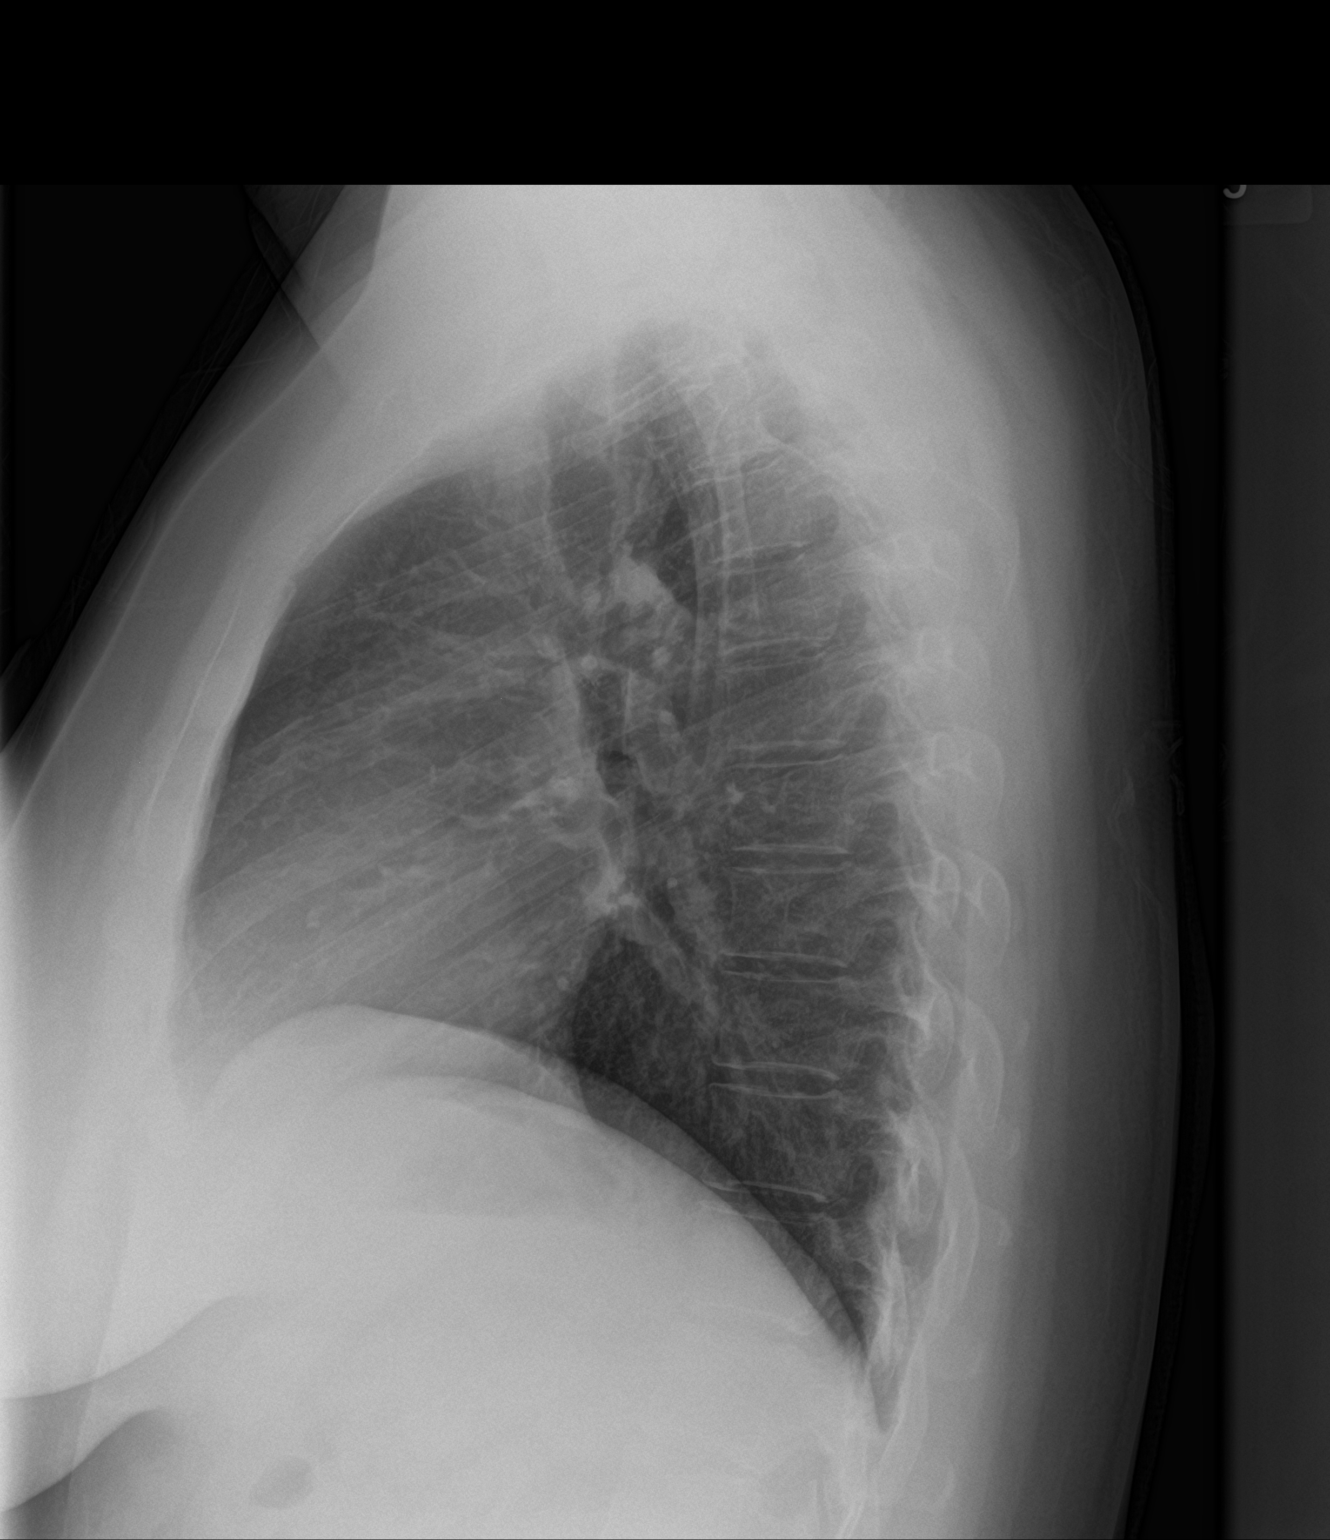

[2 of 2 positions shown; findings below may reference images not displayed]

FINDINGS: The heart size and mediastinal contours are within normal limits.
Both lungs are clear. The visualized skeletal structures are
unremarkable.
IMPRESSION: No active cardiopulmonary disease.

## 2019-09-15 ENCOUNTER — Ambulatory Visit: Payer: Medicaid Other | Attending: Internal Medicine

## 2019-09-15 DIAGNOSIS — Z23 Encounter for immunization: Secondary | ICD-10-CM

## 2019-09-15 NOTE — Progress Notes (Signed)
   Covid-19 Vaccination Clinic  Name:  Nicole Rowe    MRN: [id number] DOB: 03-21-1981  09/15/2019  Ms. Nicole Rowe was observed post Covid-19 immunization for 30 minutes based on pre-vaccination screening without incident. She was provided with Vaccine Information Sheet and instruction to access the V-Safe system.   Ms. Nicole Rowe was instructed to call 911 with any severe reactions post vaccine: Marland Kitchen Difficulty breathing  . Swelling of face and throat  . A fast heartbeat  . A bad rash all over body  . Dizziness and weakness   Immunizations Administered    Name Date Dose VIS Date Route   Pfizer COVID-19 Vaccine 09/15/2019 10:50 AM 0.3 mL 06/13/2018 Intramuscular   Manufacturer: [employer], [employer]   Lot: [id number]   NDC: [address]-2

## 2019-10-06 ENCOUNTER — Ambulatory Visit: Payer: Medicaid Other | Attending: Internal Medicine

## 2019-10-06 DIAGNOSIS — Z23 Encounter for immunization: Secondary | ICD-10-CM

## 2019-10-06 NOTE — Progress Notes (Signed)
° °  Covid-19 Vaccination Clinic  Name:  Maddilynn Esperanza    MRN: [id number] DOB: 04/28/1980  10/06/2019  Ms. Coralee Rud was observed post Covid-19 immunization for 15 minutes without incident. She was provided with Vaccine Information Sheet and instruction to access the V-Safe system.   Ms. Coralee Rud was instructed to call 911 with any severe reactions post vaccine:  Difficulty breathing   Swelling of face and throat   A fast heartbeat   A bad rash all over body   Dizziness and weakness   Immunizations Administered    Name Date Dose VIS Date Route   Pfizer COVID-19 Vaccine 10/06/2019 10:21 AM 0.3 mL 06/13/2018 Intramuscular   Manufacturer: [employer], [employer]   Lot: [id number]   NDC: [address]-9

## 2020-06-02 ENCOUNTER — Ambulatory Visit
Admission: RE | Admit: 2020-06-02 | Discharge: 2020-06-02 | Disposition: A | Discharge status: Home / Self Care | Payer: Medicaid Other | Source: Ambulatory Visit

## 2020-06-02 ENCOUNTER — Other Ambulatory Visit: Payer: Self-pay

## 2020-06-02 VITALS — BP 136/97 | HR 81 | Temp 98.2°F | Resp 18 | Ht 63.0 in | Wt 197.0 lb

## 2020-06-02 DIAGNOSIS — R030 Elevated blood-pressure reading, without diagnosis of hypertension: Secondary | ICD-10-CM

## 2020-06-02 DIAGNOSIS — Z760 Encounter for issue of repeat prescription: Secondary | ICD-10-CM

## 2020-06-02 HISTORY — DX: Calculus of kidney: N20.0

## 2020-06-02 HISTORY — DX: Depression, unspecified: F32.A

## 2020-06-02 MED ORDER — PROPRANOLOL HCL 80 MG PO TABS: 80.0000 mg | ORAL_TABLET | Freq: Two times a day (BID) | ORAL | 0 refills | Status: DC

## 2020-06-02 NOTE — ED Provider Notes (Signed)
[facility]    CSN: [id number] Arrival date & time: 06/02/20  1445      History   Chief Complaint Chief Complaint  Patient presents with  . Hypertension    HPI Nicole Rowe is a 40 y.o. female presenting for concerns about possible elevated blood pressure.  Patient states she thinks her blood pressure is elevated because her feet are little swollen.  She says are also painful to walk on.  Patient says that her feet can swell and ache when her blood pressure is elevated.  She has not checked her blood pressure at home.  Patient states that she tried to get into see her PCP last week, but was not able to make an appointment.  Patient takes propranolol for her hypertension and migraines.  She denies any other symptoms.  Denies any fatigue, headaches, vision changes, chest pain or breathing difficulty.  No palpitations or leg swelling.  No other concerns.  HPI  Past Medical History:  Diagnosis Date  . Depression   . Hypertension   . Kidney stone   . Migraine   . Syncope and collapse     Patient Active Problem List   Diagnosis Date Noted  . Pain in the chest 01/11/2014    Past Surgical History:  Procedure Laterality Date  . TUBAL LIGATION    . TYMPANOSTOMY TUBE PLACEMENT      OB History   No obstetric history on file.      Home Medications    Prior to Admission medications   Medication Sig Start Date End Date Taking? Authorizing Provider  ARIPiprazole (ABILIFY) 5 MG tablet Take 5 mg by mouth at bedtime. 01/24/20  Yes [provider]  busPIRone (BUSPAR) 10 MG tablet buspirone 10 mg tablet  TAKE 1 TABLET BY MOUTH THREE TIMES DAILY   Yes [provider]  divalproex (DEPAKOTE) 250 MG DR tablet Take 250 mg by mouth 2 (two) times daily.   Yes [provider]  eletriptan (RELPAX) 40 MG tablet Take 40 mg by mouth as needed for migraine or headache. May repeat in 2 hours if headache persists or recurs.   Yes [provider]   mirtazapine (REMERON) 15 MG tablet Take 7.5 mg by mouth daily. 01/02/20  Yes [provider]  cyclobenzaprine (FLEXERIL) 10 MG tablet Take 1 tablet (10 mg total) by mouth 2 (two) times daily as needed for muscle spasms. 08/17/17   Roxy Horseman, PA-C  ibuprofen (ADVIL) 800 MG tablet Take 1 tablet (800 mg total) by mouth every 8 (eight) hours as needed. 11/17/18   Verlee Monte, NP  naproxen (NAPROSYN) 500 MG tablet Take 1 tablet (500 mg total) by mouth 2 (two) times daily with a meal. 01/06/19   Joni Reining, PA-C  propranolol (INDERAL) 80 MG tablet Take 1 tablet (80 mg total) by mouth 2 (two) times daily. 06/02/20 07/02/20  Shirlee Latch, PA-C    Family History Family History  Problem Relation Age of Onset  . Arrhythmia Mother   . Hyperlipidemia Mother   . Heart attack Father   . Stroke Father   . Hypertension Father   . Hyperlipidemia Father     Social History Social History   Tobacco Use  . Smoking status: Former Games developer  . Smokeless tobacco: Never Used  Substance Use Topics  . Alcohol use: No  . Drug use: No     Allergies   Penicillins   Review of Systems Review of Systems  Constitutional:  Negative for fatigue.  Eyes: Negative for visual disturbance.  Respiratory: Negative for cough and shortness of breath.   Cardiovascular: Negative for chest pain, palpitations and leg swelling.  Gastrointestinal: Negative for nausea and vomiting.  Genitourinary: Negative for decreased urine volume.  Musculoskeletal: Positive for joint swelling (feet).  Neurological: Negative for dizziness, weakness and headaches.     Physical Exam Triage Vital Signs ED Triage Vitals [06/02/20 1451]  Enc Vitals Group     BP      Pulse      Resp      Temp      Temp src      SpO2      Weight 197 lb (89.4 kg)     Height 5\' 3"  (1.6 m)     Head Circumference      Peak Flow      Pain Score 5     Pain Loc      Pain Edu?      Excl. in GC?    No data found.  Updated Vital  Signs BP (!) 136/97 (BP Location: Left Arm)   Pulse 81   Temp 98.2 F (36.8 C) (Oral)   Resp 18   Ht 5\' 3"  (1.6 m)   Wt 197 lb (89.4 kg)   LMP 05/19/2020   SpO2 98%   BMI 34.90 kg/m       Physical Exam Vitals and nursing note reviewed.  Constitutional:      General: She is not in acute distress.    Appearance: Normal appearance. She is not ill-appearing or toxic-appearing.  HENT:     Head: Normocephalic and atraumatic.     Nose: Nose normal.     Mouth/Throat:     Mouth: Mucous membranes are moist.     Pharynx: Oropharynx is clear.  Eyes:     General: No scleral icterus.       Right eye: No discharge.        Left eye: No discharge.     Conjunctiva/sclera: Conjunctivae normal.  Cardiovascular:     Rate and Rhythm: Normal rate and regular rhythm.     Heart sounds: Normal heart sounds.  Pulmonary:     Effort: Pulmonary effort is normal. No respiratory distress.     Breath sounds: Normal breath sounds.  Musculoskeletal:     Cervical back: Neck supple.     Right lower leg: No edema.     Left lower leg: No edema.  Skin:    General: Skin is dry.  Neurological:     General: No focal deficit present.     Mental Status: She is alert. Mental status is at baseline.     Motor: No weakness.     Gait: Gait normal.  Psychiatric:        Mood and Affect: Mood normal.        Behavior: Behavior normal.        Thought Content: Thought content normal.      UC Treatments / Results  Labs (all labs ordered are listed, but only abnormal results are displayed) Labs Reviewed - No data to display  EKG   Radiology No results found.  Procedures Procedures (including critical care time)  Medications Ordered in UC Medications - No data to display  Initial Impression / Assessment and Plan / UC Course  I have reviewed the triage vital signs and the nursing notes.  Pertinent labs & imaging results that were available during my care of the patient  were reviewed by me and  considered in my medical decision making (see chart for details).   40 year old female presenting for refill of propranolol that she takes for hypertension.  She takes 80 milligrams twice daily.  Patient admits to aching feet along with swelling, but exam a few was normal.  Chest is clear to auscultation heart regular rate and rhythm.  Her blood pressure in the clinic is 136/97 she states she has been out of blood pressure medication for a couple of days.  I have refilled the propranolol advised her to follow with PCP for additional refills.  Advised to elevate feet to help with possible swelling.  ED precautions reviewed.  Work note provided for today.   Final Clinical Impressions(s) / UC Diagnoses   Final diagnoses:  Medication refill  Elevated blood pressure reading     Discharge Instructions     I have refilled the propranolol for you.  Check your blood pressure at home and keep a log to follow-up with your PCP about.  Talk to PCP about further refills.  Follow-up with [place] as needed.    ED Prescriptions    Medication Sig Dispense Auth. Provider   propranolol (INDERAL) 80 MG tablet Take 1 tablet (80 mg total) by mouth 2 (two) times daily. 60 tablet Gareth Morgan     PDMP not reviewed this encounter.   Shirlee Latch, PA-C 06/02/20 1519

## 2020-06-02 NOTE — ED Triage Notes (Signed)
Patient states her BP is elevated. She states she has not been checking her BP but states she knows when her BP is elevated. She is supposed to be on medication but has not been back to see her PCP.

## 2020-06-02 NOTE — Discharge Instructions (Signed)
I have refilled the propranolol for you.  Check your blood pressure at home and keep a log to follow-up with your PCP about.  Talk to PCP about further refills.  Follow-up with [place] as needed.

## 2020-08-14 ENCOUNTER — Encounter: Payer: Self-pay | Admitting: Emergency Medicine

## 2020-08-14 ENCOUNTER — Other Ambulatory Visit: Payer: Self-pay

## 2020-08-14 ENCOUNTER — Ambulatory Visit
Admission: EM | Admit: 2020-08-14 | Discharge: 2020-08-14 | Disposition: A | Discharge status: Home / Self Care | Payer: Self-pay | Attending: Family Medicine | Admitting: Family Medicine

## 2020-08-14 DIAGNOSIS — M722 Plantar fascial fibromatosis: Secondary | ICD-10-CM

## 2020-08-14 MED ORDER — DICLOFENAC SODIUM 75 MG PO TBEC: 75.0000 mg | DELAYED_RELEASE_TABLET | Freq: Two times a day (BID) | ORAL | 0 refills | Status: AC | PRN

## 2020-08-14 NOTE — Discharge Instructions (Signed)
Rest, ice, elevation.  Heel cups in shoes.  Exercises daily. All "holds" are for 3-5 seconds. Do exercises 10 times daily.  Medication as directed.  If persists, call [facility] clinic podiatry for an appt.  Take care  Dr. Adriana Simas

## 2020-08-14 NOTE — ED Provider Notes (Signed)
[facility]    CSN: [id number] Arrival date & time: 08/14/20  0816      History   Chief Complaint Chief Complaint  Patient presents with  . Foot Pain   HPI  40 year old female presents with foot pain.  2-day history of right foot pain. Pain is located at the heel. No fall, trauma, injury.  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  She states that her pain is worse first thing in the morning.  She is having difficulty bearing weight.  No reported swelling.  No bruising.  No relieving factors.  No other complaints.  Past Medical History:  Diagnosis Date  . Depression   . Hypertension   . Kidney stone   . Migraine   . Syncope and collapse     Patient Active Problem List   Diagnosis Date Noted  . Pain in the chest 01/11/2014    Past Surgical History:  Procedure Laterality Date  . TUBAL LIGATION    . TYMPANOSTOMY TUBE PLACEMENT      OB History   No obstetric history on file.      Home Medications    Prior to Admission medications   Medication Sig Start Date End Date Taking? Authorizing Provider  ARIPiprazole (ABILIFY) 5 MG tablet Take 5 mg by mouth at bedtime. 01/24/20  Yes [provider]  busPIRone (BUSPAR) 10 MG tablet buspirone 10 mg tablet  TAKE 1 TABLET BY MOUTH THREE TIMES DAILY   Yes [provider]  diclofenac (VOLTAREN) 75 MG EC tablet Take 1 tablet (75 mg total) by mouth 2 (two) times daily as needed for mild pain or moderate pain. 08/14/20  Yes Nicko Daher G, DO  divalproex (DEPAKOTE) 250 MG DR tablet Take 250 mg by mouth 2 (two) times daily.   Yes [provider]  eletriptan (RELPAX) 40 MG tablet Take 40 mg by mouth as needed for migraine or headache. May repeat in 2 hours if headache persists or recurs.   Yes [provider]  mirtazapine (REMERON) 15 MG tablet Take 7.5 mg by mouth daily. 01/02/20  Yes [provider]  propranolol (INDERAL) 80 MG tablet Take 1 tablet (80 mg  total) by mouth 2 (two) times daily. 06/02/20 07/02/20  Shirlee Latch, PA-C    Family History Family History  Problem Relation Age of Onset  . Arrhythmia Mother   . Hyperlipidemia Mother   . Heart attack Father   . Stroke Father   . Hypertension Father   . Hyperlipidemia Father     Social History Social History   Tobacco Use  . Smoking status: Former Games developer  . Smokeless tobacco: Never Used  Substance Use Topics  . Alcohol use: No  . Drug use: No     Allergies   Penicillins   Review of Systems Review of Systems  Constitutional: Negative.   Musculoskeletal:       Right foot pain.   Physical Exam Triage Vital Signs ED Triage Vitals  Enc Vitals Group     BP 08/14/20 0834 120/88     Pulse Rate 08/14/20 0834 60     Resp 08/14/20 0834 18     Temp 08/14/20 0834 98 F (36.7 C)     Temp Source 08/14/20 0834 Oral     SpO2 08/14/20 0834 99 %     Weight 08/14/20 0832 195 lb (88.5 kg)     Height 08/14/20 0832 5\' 3"  (1.6 m)  Head Circumference --      Peak Flow --      Pain Score 08/14/20 0832 6     Pain Loc --      Pain Edu? --      Excl. in GC? --    Updated Vital Signs BP 120/88 (BP Location: Left Arm)   Pulse 60   Temp 98 F (36.7 C) (Oral)   Resp 18   Ht 5\' 3"  (1.6 m)   Wt 88.5 kg   LMP 08/13/2020   SpO2 99%   BMI 34.54 kg/m   Visual Acuity Right Eye Distance:   Left Eye Distance:   Bilateral Distance:    Right Eye Near:   Left Eye Near:    Bilateral Near:     Physical Exam Vitals and nursing note reviewed.  Constitutional:      General: She is not in acute distress.    Appearance: Normal appearance. She is not ill-appearing.  HENT:     Head: Normocephalic and atraumatic.  Eyes:     General:        Right eye: No discharge.        Left eye: No discharge.     Conjunctiva/sclera: Conjunctivae normal.  Pulmonary:     Effort: Pulmonary effort is normal. No respiratory distress.  Musculoskeletal:     Comments: Right foot -tenderness over  the heel at the attachment site of the plantar fascia.  Neurological:     Mental Status: She is alert.  Psychiatric:        Mood and Affect: Mood normal.        Behavior: Behavior normal.    UC Treatments / Results  Labs (all labs ordered are listed, but only abnormal results are displayed) Labs Reviewed - No data to display  EKG   Radiology No results found.  Procedures Procedures (including critical care time)  Medications Ordered in UC Medications - No data to display  Initial Impression / Assessment and Plan / UC Course  I have reviewed the triage vital signs and the nursing notes.  Pertinent labs & imaging results that were available during my care of the patient were reviewed by me and considered in my medical decision making (see chart for details).    40 year old female presents with plantar fasciitis.  Treating with rest, ice, elevation, heel cups, exercises, and anti-inflammatory.  Rx sent to the pharmacy.  Work note given.  Final Clinical Impressions(s) / UC Diagnoses   Final diagnoses:  Plantar fasciitis     Discharge Instructions     Rest, ice, elevation.  Heel cups in shoes.  Exercises daily. All "holds" are for 3-5 seconds. Do exercises 10 times daily.  Medication as directed.  If persists, call [facility] clinic podiatry for an appt.  Take care  Dr. [facility]    ED Prescriptions    Medication Sig Dispense Auth. Provider   diclofenac (VOLTAREN) 75 MG EC tablet Take 1 tablet (75 mg total) by mouth 2 (two) times daily as needed for mild pain or moderate pain. 30 tablet Adriana Simas, DO     PDMP not reviewed this encounter.   Tommie Sams [place], [place] 08/14/20 [phone]

## 2020-08-14 NOTE — ED Triage Notes (Signed)
Patient c/o right foot pain that started 2 days ago. She denies injury.

## 2021-09-18 DIAGNOSIS — M2391 Unspecified internal derangement of right knee: Secondary | ICD-10-CM | POA: Diagnosis not present

## 2021-09-18 DIAGNOSIS — S8991XA Unspecified injury of right lower leg, initial encounter: Secondary | ICD-10-CM | POA: Diagnosis not present

## 2021-09-27 DIAGNOSIS — I10 Essential (primary) hypertension: Secondary | ICD-10-CM | POA: Diagnosis not present

## 2021-09-27 DIAGNOSIS — G43909 Migraine, unspecified, not intractable, without status migrainosus: Secondary | ICD-10-CM | POA: Diagnosis not present

## 2021-09-27 DIAGNOSIS — Z880 Allergy status to penicillin: Secondary | ICD-10-CM | POA: Diagnosis not present

## 2021-09-27 DIAGNOSIS — Z87891 Personal history of nicotine dependence: Secondary | ICD-10-CM | POA: Diagnosis not present

## 2021-09-27 DIAGNOSIS — M25562 Pain in left knee: Secondary | ICD-10-CM | POA: Diagnosis not present

## 2021-09-27 DIAGNOSIS — W19XXXA Unspecified fall, initial encounter: Secondary | ICD-10-CM | POA: Diagnosis not present

## 2022-05-04 DIAGNOSIS — M25562 Pain in left knee: Secondary | ICD-10-CM | POA: Diagnosis not present

## 2022-05-04 DIAGNOSIS — S8392XA Sprain of unspecified site of left knee, initial encounter: Secondary | ICD-10-CM | POA: Diagnosis not present

## 2022-05-04 DIAGNOSIS — G43909 Migraine, unspecified, not intractable, without status migrainosus: Secondary | ICD-10-CM | POA: Diagnosis not present

## 2022-05-04 DIAGNOSIS — X58XXXA Exposure to other specified factors, initial encounter: Secondary | ICD-10-CM | POA: Diagnosis not present

## 2022-05-04 DIAGNOSIS — Z87891 Personal history of nicotine dependence: Secondary | ICD-10-CM | POA: Diagnosis not present

## 2022-05-04 DIAGNOSIS — Z888 Allergy status to other drugs, medicaments and biological substances status: Secondary | ICD-10-CM | POA: Diagnosis not present

## 2022-05-04 DIAGNOSIS — Z880 Allergy status to penicillin: Secondary | ICD-10-CM | POA: Diagnosis not present

## 2022-05-04 DIAGNOSIS — I10 Essential (primary) hypertension: Secondary | ICD-10-CM | POA: Diagnosis not present

## 2022-05-04 DIAGNOSIS — Z79899 Other long term (current) drug therapy: Secondary | ICD-10-CM | POA: Diagnosis not present

## 2022-05-19 DIAGNOSIS — Z0000 Encounter for general adult medical examination without abnormal findings: Secondary | ICD-10-CM | POA: Diagnosis not present

## 2022-05-19 DIAGNOSIS — F418 Other specified anxiety disorders: Secondary | ICD-10-CM | POA: Diagnosis not present

## 2022-05-19 DIAGNOSIS — N951 Menopausal and female climacteric states: Secondary | ICD-10-CM | POA: Diagnosis not present

## 2022-05-19 DIAGNOSIS — R5383 Other fatigue: Secondary | ICD-10-CM | POA: Diagnosis not present

## 2022-05-21 ENCOUNTER — Other Ambulatory Visit: Payer: Self-pay | Admitting: Primary Care

## 2022-05-21 DIAGNOSIS — Z1231 Encounter for screening mammogram for malignant neoplasm of breast: Secondary | ICD-10-CM

## 2022-05-27 ENCOUNTER — Ambulatory Visit
Admission: RE | Admit: 2022-05-27 | Discharge: 2022-05-27 | Disposition: A | Discharge status: Home / Self Care | Payer: Medicaid Other | Source: Ambulatory Visit | Attending: Primary Care | Admitting: Primary Care

## 2022-05-27 DIAGNOSIS — Z1231 Encounter for screening mammogram for malignant neoplasm of breast: Secondary | ICD-10-CM | POA: Insufficient documentation

## 2022-06-18 ENCOUNTER — Ambulatory Visit: Payer: Medicaid Other | Attending: Cardiology | Admitting: Cardiology

## 2022-06-18 ENCOUNTER — Encounter: Payer: Self-pay | Admitting: Cardiology

## 2022-06-18 VITALS — BP 106/72 | HR 73 | Ht 63.0 in | Wt 208.0 lb

## 2022-06-18 DIAGNOSIS — R079 Chest pain, unspecified: Secondary | ICD-10-CM | POA: Diagnosis not present

## 2022-06-18 DIAGNOSIS — R0683 Snoring: Secondary | ICD-10-CM

## 2022-06-18 DIAGNOSIS — I10 Essential (primary) hypertension: Secondary | ICD-10-CM | POA: Diagnosis not present

## 2022-06-18 DIAGNOSIS — R072 Precordial pain: Secondary | ICD-10-CM

## 2022-06-18 MED ORDER — METOPROLOL TARTRATE 100 MG PO TABS: 100.0000 mg | ORAL_TABLET | Freq: Once | ORAL | 0 refills | Status: DC

## 2022-06-18 NOTE — Patient Instructions (Signed)
Medication Instructions:  Your physician recommends that you continue on your current medications as directed. Please refer to the Current Medication list given to you today.  *If you need a refill on your cardiac medications before your next appointment, please call your pharmacy*   Lab Work: Your physician recommends that you return for lab work to be completed within 30 days of cardiac CT: CBC & [place] Entrance at [facility] 1st desk on the right to check in (REGISTRATION)  Lab hours: Monday- Friday (7:30 am- 5:30 pm)  If you have labs (blood work) drawn today and your tests are completely normal, you will receive your results only by: MyChart Message (if you have MyChart) OR A paper copy in the mail If you have any lab test that is abnormal or we need to change your treatment, we will call you to review the results.   Testing/Procedures: Your physician has requested that you have an echocardiogram. Echocardiography is a painless test that uses sound waves to create images of your heart. It provides your doctor with information about the size and shape of your heart and how well your heart's chambers and valves are working. This procedure takes approximately one hour. There are no restrictions for this procedure. Please do NOT wear cologne, perfume, aftershave, or lotions (deodorant is allowed). Please arrive 15 minutes prior to your appointment time.    Your cardiac CT will be scheduled at one of the below locations:   [facility] [address] [place], [place] [address] (336) [facility] [place], [place] [address] [phone]  If scheduled at [facility] or [facility], please arrive 15 mins early for check-in and test prep.   Please follow these instructions carefully (unless otherwise directed):  On the Night Before the  Test: Be sure to Drink plenty of water. Do not consume any caffeinated/decaffeinated beverages or chocolate 12 hours prior to your test. Do not take any antihistamines 12 hours prior to your test.  On the Day of the Test: Drink plenty of water until 1 hour prior to the test. Do not eat any food 1 hour prior to test. You may take your regular medications prior to the test.  Take metoprolol (Lopressor) two hours prior to test. Hold lisinopril-hydrochlorothiazide (ZESTORETIC) day before and day of CT If you take Furosemide/Hydrochlorothiazide/Spironolactone, please HOLD on the morning of the test. FEMALES- please wear underwire-free bra if available, avoid dresses & tight clothing    After the Test: Drink plenty of water. After receiving IV contrast, you may experience a mild flushed feeling. This is normal. On occasion, you may experience a mild rash up to 24 hours after the test. This is not dangerous. If this occurs, you can take Benadryl 25 mg and increase your fluid intake. If you experience trouble breathing, this can be serious. If it is severe call 911 IMMEDIATELY. If it is mild, please call our office. If you take any of these medications: Glipizide/Metformin, Avandament, Glucavance, please do not take 48 hours after completing test unless otherwise instructed.  We will call to schedule your test 2-4 weeks out understanding that some insurance companies will need an authorization prior to the service being performed.   For non-scheduling related questions, please contact the cardiac imaging nurse navigator should you have any questions/concerns: Marchia Bond, Cardiac Imaging Nurse Navigator Gordy Clement, Cardiac Imaging Nurse Navigator [facility] Direct Office Dial: [phone]   For scheduling needs, including cancellations and rescheduling, please call [place], [phone].    Follow-Up: At [facility], you and your health needs are  our priority.  As part of our continuing mission to provide you with exceptional heart care, we have created designated Provider Care Teams.  These Care Teams include your primary Cardiologist (physician) and Advanced Practice Providers (APPs -  Physician Assistants and Nurse Practitioners) who all work together to provide you with the care you need, when you need it.  We recommend signing up for the patient portal called "MyChart".  Sign up information is provided on this After Visit Summary.  MyChart is used to connect with patients for Virtual Visits (Telemedicine).  Patients are able to view lab/test results, encounter notes, upcoming appointments, etc.  Non-urgent messages can be sent to your provider as well.   To learn more about what you can do with MyChart, go to [email or web address].    Your next appointment:   2 month(s)  Provider:   You may see Kate Sable, MD or one of the following Advanced Practice Providers on your designated Care Team:   Murray Hodgkins, NP Christell Faith, PA-C Cadence Kathlen Mody, PA-C Gerrie Nordmann, NP Other Instructions -None

## 2022-06-18 NOTE — Progress Notes (Signed)
Cardiology Office Note:    Date:  06/18/2022   ID:  Nicole Rowe, DOB September 24, 1980, MRN [id number]  PCP:  [place] [facility], [place] Providers Cardiologist:  None     Referring MD: Freddy Finner, NP   Chief Complaint  Patient presents with   New Patient (Initial Visit)    Chest Pain, SOB, HTN, Cardiac Hx w/[place] 2015,     History of Present Illness:    Nicole Rowe is a 42 y.o. female with a hx of hypertension, former smoker x 7 years, family history of early CAD who presents due to chest pain.  States having symptoms of chest pain or shortness of breath ongoing over the past month.  Symptoms usually occur with exertion, relieved with rest.  Also endorsed having shortness of breath with minimal activity.  Patient's father had a heart attack in his 18s.  Patient endorsed snoring, daytime fatigue and somnolence.  Echo 2015 EF 60 to 65%  Past Medical History:  Diagnosis Date   Depression    Hypertension    Kidney stone    Migraine    Syncope and collapse     Past Surgical History:  Procedure Laterality Date   TUBAL LIGATION     TYMPANOSTOMY TUBE PLACEMENT      Current Medications: Current Meds  Medication Sig   ARIPiprazole (ABILIFY) 5 MG tablet Take 5 mg by mouth at bedtime.   eletriptan (RELPAX) 40 MG tablet Take 40 mg by mouth as needed for migraine or headache. May repeat in 2 hours if headache persists or recurs.   ergocalciferol (VITAMIN D2) 1.25 MG (50000 UT) capsule Take 50,000 Units by mouth once a week.   lisinopril-hydrochlorothiazide (ZESTORETIC) 20-25 MG tablet Take 1 tablet by mouth daily.   metoprolol tartrate (LOPRESSOR) 100 MG tablet Take 1 tablet (100 mg total) by mouth once for 1 dose.   mirtazapine (REMERON) 15 MG tablet Take 7.5 mg by mouth daily.     Allergies:   Amlodipine and Penicillins   Social History   Socioeconomic History   Marital status: Married    Spouse name: Not on file   Number of children: Not on file    Years of education: Not on file   Highest education level: Not on file  Occupational History   Not on file  Tobacco Use   Smoking status: Former   Smokeless tobacco: Never   Tobacco comments:    Quit 2019  Substance and Sexual Activity   Alcohol use: No    Comment: occasional   Drug use: No   Sexual activity: Yes  Other Topics Concern   Not on file  Social History Narrative   Not on file   Social Determinants of Health   Financial Resource Strain: Not on file  Food Insecurity: Not on file  Transportation Needs: Not on file  Physical Activity: Not on file  Stress: Not on file  Social Connections: Not on file     Family History: The patient's family history includes Arrhythmia in her mother; Breast cancer in her mother; Heart attack in her father; Hyperlipidemia in her father and mother; Hypertension in her father; Stroke in her father.  ROS:   Please see the history of present illness.     All other systems reviewed and are negative.  EKGs/Labs/Other Studies Reviewed:    The following studies were reviewed today:   EKG:  EKG is  ordered today.  The ekg ordered today demonstrates normal  sinus rhythm, normal ECG  Recent Labs: No results found for requested labs within last 365 days.  Recent Lipid Panel No results found for: "CHOL", "TRIG", "HDL", "CHOLHDL", "VLDL", "LDLCALC", "LDLDIRECT"   Risk Assessment/Calculations:             Physical Exam:    VS:  BP 106/72 (BP Location: Right Arm)   Pulse 73   Ht '5\' 3"'$  (1.6 m)   Wt 208 lb (94.3 kg)   SpO2 99%   BMI 36.85 kg/m     Wt Readings from Last 3 Encounters:  06/18/22 208 lb (94.3 kg)  08/14/20 195 lb (88.5 kg)  06/02/20 197 lb (89.4 kg)     GEN:  Well nourished, well developed in no acute distress HEENT: Normal NECK: No JVD; No carotid bruits CARDIAC: RRR, no murmurs, rubs, gallops RESPIRATORY:  Clear to auscultation without rales, wheezing or rhonchi  ABDOMEN: Soft, non-tender,  non-distended MUSCULOSKELETAL:  No edema; No deformity  SKIN: Warm and dry NEUROLOGIC:  Alert and oriented x 3 PSYCHIATRIC:  Normal affect   ASSESSMENT:    1. Precordial pain   2. Hypertension, unspecified type   3. Snoring   4. Chest pain, unspecified type    PLAN:    In order of problems listed above:  Chest pain, risk factors family history of early CAD, hypertension.  Get echo, get coronary CTA. Hypertension, BP controlled.  Continue Zestoretic.  Hold BP meds day before and day of coronary CTA testing. Snoring, daytime fatigue, somnolence.  Refer to sleep specialist for OSA eval.  Follow-up after echo and coronary CT.       Medication Adjustments/Labs and Tests Ordered: Current medicines are reviewed at length with the patient today.  Concerns regarding medicines are outlined above.  Orders Placed This Encounter  Procedures   CT CORONARY MORPH W/CTA COR W/SCORE W/CA W/CM &/OR WO/CM   CBC   Basic Metabolic Panel (BMET)   Ambulatory referral to Pulmonology   EKG 12-Lead   ECHOCARDIOGRAM COMPLETE   Meds ordered this encounter  Medications   metoprolol tartrate (LOPRESSOR) 100 MG tablet    Sig: Take 1 tablet (100 mg total) by mouth once for 1 dose.    Dispense:  1 tablet    Refill:  0    Patient Instructions  Medication Instructions:  Your physician recommends that you continue on your current medications as directed. Please refer to the Current Medication list given to you today.  *If you need a refill on your cardiac medications before your next appointment, please call your pharmacy*   Lab Work: Your physician recommends that you return for lab work to be completed within 30 days of cardiac CT: CBC & [place] Entrance at [facility] 1st desk on the right to check in (REGISTRATION)  Lab hours: Monday- Friday (7:30 am- 5:30 pm)  If you have labs (blood work) drawn today and your tests are completely normal, you will receive your results only by: MyChart  Message (if you have MyChart) OR A paper copy in the mail If you have any lab test that is abnormal or we need to change your treatment, we will call you to review the results.   Testing/Procedures: Your physician has requested that you have an echocardiogram. Echocardiography is a painless test that uses sound waves to create images of your heart. It provides your doctor with information about the size and shape of your heart and how well your heart's chambers and valves are working. This  procedure takes approximately one hour. There are no restrictions for this procedure. Please do NOT wear cologne, perfume, aftershave, or lotions (deodorant is allowed). Please arrive 15 minutes prior to your appointment time.    Your cardiac CT will be scheduled at one of the below locations:   [facility] [address] [place], [place] [address] (336) [facility] [place], [place] [address] [phone]  If scheduled at [facility] or [facility], please arrive 15 mins early for check-in and test prep.   Please follow these instructions carefully (unless otherwise directed):  On the Night Before the Test: Be sure to Drink plenty of water. Do not consume any caffeinated/decaffeinated beverages or chocolate 12 hours prior to your test. Do not take any antihistamines 12 hours prior to your test.  On the Day of the Test: Drink plenty of water until 1 hour prior to the test. Do not eat any food 1 hour prior to test. You may take your regular medications prior to the test.  Take metoprolol (Lopressor) two hours prior to test. Hold lisinopril-hydrochlorothiazide (ZESTORETIC) day before and day of CT If you take Furosemide/Hydrochlorothiazide/Spironolactone, please HOLD on the morning of the test. FEMALES- please wear underwire-free bra if available,  avoid dresses & tight clothing    After the Test: Drink plenty of water. After receiving IV contrast, you may experience a mild flushed feeling. This is normal. On occasion, you may experience a mild rash up to 24 hours after the test. This is not dangerous. If this occurs, you can take Benadryl 25 mg and increase your fluid intake. If you experience trouble breathing, this can be serious. If it is severe call 911 IMMEDIATELY. If it is mild, please call our office. If you take any of these medications: Glipizide/Metformin, Avandament, Glucavance, please do not take 48 hours after completing test unless otherwise instructed.  We will call to schedule your test 2-4 weeks out understanding that some insurance companies will need an authorization prior to the service being performed.   For non-scheduling related questions, please contact the cardiac imaging nurse navigator should you have any questions/concerns: Marchia Bond, Cardiac Imaging Nurse Navigator Gordy Clement, Cardiac Imaging Nurse Navigator [facility] Direct Office Dial: [phone]   For scheduling needs, including cancellations and rescheduling, please call [place], [phone].    Follow-Up: At [facility], you and your health needs are our priority.  As part of our continuing mission to provide you with exceptional heart care, we have created designated Provider Care Teams.  These Care Teams include your primary Cardiologist (physician) and Advanced Practice Providers (APPs -  Physician Assistants and Nurse Practitioners) who all work together to provide you with the care you need, when you need it.  We recommend signing up for the patient portal called "MyChart".  Sign up information is provided on this After Visit Summary.  MyChart is used to connect with patients for Virtual Visits (Telemedicine).  Patients are able to view lab/test results, encounter notes, upcoming appointments, etc.   Non-urgent messages can be sent to your provider as well.   To learn more about what you can do with MyChart, go to [email or web address].    Your next appointment:   2 month(s)  Provider:   You may see Kate Sable, MD or one of the following Advanced Practice Providers on your designated Care Team:  Murray Hodgkins, NP Christell Faith, PA-C Cadence Kathlen Mody, PA-C Gerrie Nordmann, NP Other Instructions -None   Signed, Kate Sable, MD  06/18/2022 12:39 PM    [place]

## 2022-06-24 ENCOUNTER — Other Ambulatory Visit
Admission: RE | Admit: 2022-06-24 | Discharge: 2022-06-24 | Disposition: A | Discharge status: Home / Self Care | Payer: Medicaid Other | Attending: Cardiology | Admitting: Cardiology

## 2022-06-24 DIAGNOSIS — R072 Precordial pain: Secondary | ICD-10-CM | POA: Insufficient documentation

## 2022-06-24 DIAGNOSIS — R079 Chest pain, unspecified: Secondary | ICD-10-CM | POA: Insufficient documentation

## 2022-06-24 LAB — BASIC METABOLIC PANEL
Anion gap: 8 (ref 5–15)
BUN: 16 mg/dL (ref 6–20)
CO2: 28 mmol/L (ref 22–32)
Calcium: 9.4 mg/dL (ref 8.9–10.3)
Chloride: 102 mmol/L (ref 98–111)
Creatinine, Ser: 0.76 mg/dL (ref 0.44–1.00)
GFR, Estimated: >60 mL/min (ref >60)
Glucose, Bld: 96 mg/dL (ref 70–99)
Potassium: 3.5 mmol/L (ref 3.5–5.1)
Sodium: 138 mmol/L (ref 135–145)

## 2022-06-24 LAB — CBC
HCT: 39.1 % (ref 36.0–46.0)
Hemoglobin: 12.8 g/dL (ref 12.0–15.0)
MCH: 28.1 pg (ref 26.0–34.0)
MCHC: 32.7 g/dL (ref 30.0–36.0)
MCV: 85.9 fL (ref 80.0–100.0)
Platelets: 320 10*3/uL (ref 150–400)
RBC: 4.55 MIL/uL (ref 3.87–5.11)
RDW: 13.2 % (ref 11.5–15.5)
WBC: 9.6 10*3/uL (ref 4.0–10.5)
nRBC: 0 % (ref 0.0–0.2)

## 2022-06-29 ENCOUNTER — Telehealth: Payer: Self-pay | Admitting: Cardiology

## 2022-06-29 NOTE — Telephone Encounter (Signed)
Called patient.   Patient reports that she went to [facility] Sunday due to bilateral leg pain. Patient reports that there is a little swelling in both legs.   Patient reports that they put her on gabapentin three times a day - which isn't helping.   Patient reports that [facility] put her out of work until today. She states that she is currently elevating her legs right now but does not think she can go to work today.   Patient reports that she is having some chest pain but it is only when she is moving.    She is just looking for any recommendations. Offered her an appt but she reports that she is a few hours away staying with someone to help take care of her.

## 2022-06-29 NOTE — Telephone Encounter (Signed)
Pt states her legs are burning. She states they were swelling yesterday but not today. She states they advised her to see reach out to her PCP but they didn't have any soon availability. She wants to know what Dr. Mylo Red suggest.

## 2022-06-30 ENCOUNTER — Ambulatory Visit
Admission: RE | Admit: 2022-06-30 | Discharge: 2022-06-30 | Disposition: A | Discharge status: Home / Self Care | Payer: Medicaid Other | Source: Ambulatory Visit | Attending: Primary Care | Admitting: Primary Care

## 2022-06-30 ENCOUNTER — Other Ambulatory Visit: Payer: Self-pay | Admitting: Primary Care

## 2022-06-30 ENCOUNTER — Ambulatory Visit
Admission: RE | Admit: 2022-06-30 | Discharge: 2022-06-30 | Disposition: A | Discharge status: Home / Self Care | Payer: Medicaid Other | Attending: Primary Care | Admitting: Primary Care

## 2022-06-30 DIAGNOSIS — G5793 Unspecified mononeuropathy of bilateral lower limbs: Secondary | ICD-10-CM | POA: Diagnosis present

## 2022-06-30 NOTE — Telephone Encounter (Addendum)
Called patient with Dr. Eddie North recommendations:   Nicole Sable, MD  You1 hour ago (1:21 PM)   "Patient needs to be evaluated for recommendations regarding leg pains.  I do not manage neuropathy, as such, cannot offer recommendations."  Ty BA   Patient stated that she was in so much pain and her primary care was able to work her in yesterday regarding this issue.

## 2022-07-14 ENCOUNTER — Encounter (HOSPITAL_COMMUNITY): Payer: Self-pay

## 2022-07-15 ENCOUNTER — Ambulatory Visit
Admission: RE | Admit: 2022-07-15 | Discharge: 2022-07-15 | Disposition: A | Discharge status: Home / Self Care | Payer: Medicaid Other | Source: Ambulatory Visit | Attending: Cardiology | Admitting: Cardiology

## 2022-07-15 DIAGNOSIS — R072 Precordial pain: Secondary | ICD-10-CM | POA: Diagnosis not present

## 2022-07-15 DIAGNOSIS — R079 Chest pain, unspecified: Secondary | ICD-10-CM | POA: Insufficient documentation

## 2022-07-15 MED ORDER — METOPROLOL TARTRATE 5 MG/5ML IV SOLN
10.0000 mg | Freq: Once | INTRAVENOUS | Status: AC
  Administered 2022-07-15: 10 mg via INTRAVENOUS

## 2022-07-15 MED ORDER — NITROGLYCERIN 0.4 MG SL SUBL
0.4000 mg | SUBLINGUAL_TABLET | Freq: Once | SUBLINGUAL | Status: AC
  Administered 2022-07-15: 0.4 mg via SUBLINGUAL

## 2022-07-15 MED ORDER — NITROGLYCERIN 0.4 MG SL SUBL: 0.8000 mg | SUBLINGUAL_TABLET | Freq: Once | SUBLINGUAL | Status: DC

## 2022-07-15 MED ORDER — IOHEXOL 350 MG/ML SOLN
100.0000 mL | Freq: Once | INTRAVENOUS | Status: AC | PRN
  Administered 2022-07-15: 100 mL via INTRAVENOUS

## 2022-07-15 NOTE — Progress Notes (Signed)
Patient tolerated CT well. Drank water after. Vital signs stable encourage to drink water throughout day.Reasons explained and verbalized understanding. Ambulated steady gait.  

## 2022-08-04 ENCOUNTER — Ambulatory Visit: Payer: Medicaid Other | Attending: Cardiology

## 2022-08-04 DIAGNOSIS — I10 Essential (primary) hypertension: Secondary | ICD-10-CM

## 2022-08-04 DIAGNOSIS — R072 Precordial pain: Secondary | ICD-10-CM

## 2022-08-04 LAB — ECHOCARDIOGRAM COMPLETE
AR max vel: 2.2 cm2
AV Area VTI: 2.29 cm2
AV Area mean vel: 2.2 cm2
AV Mean grad: 3 mmHg
AV Peak grad: 5.7 mmHg
Ao pk vel: 1.2 m/s
Area-P 1/2: 4.06 cm2
Calc EF: 56.4 %
S' Lateral: 2.7 cm
Single Plane A2C EF: 55.7 %
Single Plane A4C EF: 58 %

## 2022-08-18 ENCOUNTER — Other Ambulatory Visit
Admission: RE | Admit: 2022-08-18 | Discharge: 2022-08-18 | Disposition: A | Discharge status: Home / Self Care | Payer: Medicaid Other | Source: Ambulatory Visit | Attending: Cardiology | Admitting: Cardiology

## 2022-08-18 ENCOUNTER — Ambulatory Visit: Payer: Medicaid Other | Attending: Cardiology | Admitting: Cardiology

## 2022-08-18 ENCOUNTER — Encounter: Payer: Self-pay | Admitting: Cardiology

## 2022-08-18 VITALS — BP 120/80 | HR 84 | Ht 63.0 in | Wt 207.1 lb

## 2022-08-18 DIAGNOSIS — I2510 Atherosclerotic heart disease of native coronary artery without angina pectoris: Secondary | ICD-10-CM | POA: Insufficient documentation

## 2022-08-18 DIAGNOSIS — I10 Essential (primary) hypertension: Secondary | ICD-10-CM

## 2022-08-18 DIAGNOSIS — I2584 Coronary atherosclerosis due to calcified coronary lesion: Secondary | ICD-10-CM

## 2022-08-18 LAB — LIPID PANEL
Cholesterol: 188 mg/dL (ref 0–200)
HDL: 48 mg/dL (ref >40)
LDL Cholesterol: 123 mg/dL — ABNORMAL HIGH (ref 0–99)
Total CHOL/HDL Ratio: 3.9 RATIO
Triglycerides: 86 mg/dL (ref <150)
VLDL: 17 mg/dL (ref 0–40)

## 2022-08-18 NOTE — Progress Notes (Signed)
Cardiology Office Note:    Date:  08/18/2022   ID:  Nicole Rowe, DOB [birth year], MRN [id number]  PCP:  Sandrea Hughs, NP   [facility] Providers Cardiologist:  Debbe Odea, MD     Referring MD: Hillery Aldo, MD   Chief Complaint  Patient presents with   Follow up cardiac testing     Patient c/o restless legs/ pain in legs with walking daily & occasional chest pain but no more than before cardiac testing.     History of Present Illness:    Nicole Rowe is a 42 y.o. female with a hx of hypertension, former smoker x 7 years, family history of early CAD who presents for follow-up.  Previously seen due to symptoms of chest pain, coronary CTA and echocardiogram was obtained.  Still has occasional chest pain occurring with exertion, similar to previous visit.  Presents for cardiac testing results.  History of snoring and fatigue, previously referred to sleep specialist.  Prior notes/studies Patient's father had a heart attack in his 78s.   Echo 2015 EF 60 to 65%  Past Medical History:  Diagnosis Date   Depression    Hypertension    Kidney stone    Migraine    Syncope and collapse     Past Surgical History:  Procedure Laterality Date   TUBAL LIGATION     TYMPANOSTOMY TUBE PLACEMENT      Current Medications: Current Meds  Medication Sig   BAC 50-325-40 MG tablet Take 1-2 tablets by mouth every 4 (four) hours as needed for migraine.   cyanocobalamin (VITAMIN B12) 1000 MCG tablet Take 1,000 mcg by mouth daily.   eletriptan (RELPAX) 40 MG tablet Take 40 mg by mouth as needed for migraine or headache. May repeat in 2 hours if headache persists or recurs.   ergocalciferol (VITAMIN D2) 1.25 MG (50000 UT) capsule Take 50,000 Units by mouth once a week.   lisinopril-hydrochlorothiazide (ZESTORETIC) 20-25 MG tablet Take 1 tablet by mouth daily.     Allergies:   Amlodipine, Penicillins, and Other   Social History   Socioeconomic History   Marital status:  Married    Spouse name: Not on file   Number of children: Not on file   Years of education: Not on file   Highest education level: Not on file  Occupational History   Not on file  Tobacco Use   Smoking status: Former   Smokeless tobacco: Never   Tobacco comments:    Quit 2019  Vaping Use   Vaping Use: Never used  Substance and Sexual Activity   Alcohol use: No    Comment: occasional   Drug use: No   Sexual activity: Yes  Other Topics Concern   Not on file  Social History Narrative   Not on file   Social Determinants of Health   Financial Resource Strain: Not on file  Food Insecurity: Not on file  Transportation Needs: Not on file  Physical Activity: Not on file  Stress: Not on file  Social Connections: Not on file     Family History: The patient's family history includes Arrhythmia in her mother; Breast cancer in her mother; Heart attack in her father; Hyperlipidemia in her father and mother; Hypertension in her father; Stroke in her father.  ROS:   Please see the history of present illness.     All other systems reviewed and are negative.  EKGs/Labs/Other Studies Reviewed:    The following studies were reviewed today:  EKG:  EKG not ordered today.   Recent Labs: 06/24/2022: BUN 16; Creatinine, Ser 0.76; Hemoglobin 12.8; Platelets 320; Potassium 3.5; Sodium 138  Recent Lipid Panel No results found for: "CHOL", "TRIG", "HDL", "CHOLHDL", "VLDL", "LDLCALC", "LDLDIRECT"   Risk Assessment/Calculations:             Physical Exam:    VS:  BP 120/80 (BP Location: Left Arm, Patient Position: Sitting, Cuff Size: Normal)   Pulse 84   Ht 5\' 3"  (1.6 m)   Wt 207 lb 2 oz (94 kg)   SpO2 97%   BMI 36.69 kg/m     Wt Readings from Last 3 Encounters:  08/18/22 207 lb 2 oz (94 kg)  06/18/22 208 lb (94.3 kg)  08/14/20 195 lb (88.5 kg)     GEN:  Well nourished, well developed in no acute distress HEENT: Normal NECK: No JVD; No carotid bruits CARDIAC: RRR, no  murmurs, rubs, gallops RESPIRATORY:  Clear to auscultation without rales, wheezing or rhonchi  ABDOMEN: Soft, non-tender, non-distended MUSCULOSKELETAL:  No edema; left chest wall tenderness on palpation SKIN: Warm and dry NEUROLOGIC:  Alert and oriented x 3 PSYCHIATRIC:  Normal affect   ASSESSMENT:    1. Coronary artery calcification   2. Hypertension, unspecified type     PLAN:    In order of problems listed above:  Chest pains, minimal coronary calcifications in proximal LAD, calcium score was 62.5.  Echo with normal EF 55 to 60%.  Chest tenderness on palpation, indicating musculoskeletal etiology.  Follow-up with PCP regarding management.  Obtain fasting lipid profile today. Hypertension, BP controlled.  Continue Zestoretic.   Follow-up 1 year.     Medication Adjustments/Labs and Tests Ordered: Current medicines are reviewed at length with the patient today.  Concerns regarding medicines are outlined above.  Orders Placed This Encounter  Procedures   Lipid panel   No orders of the defined types were placed in this encounter.   Patient Instructions  Medication Instructions:   Your physician recommends that you continue on your current medications as directed. Please refer to the Current Medication list given to you today.  *If you need a refill on your cardiac medications before your next appointment, please call your pharmacy*   Lab Work:  Your physician recommends you go to the medical mall to have lab work completed.   If you have labs (blood work) drawn today and your tests are completely normal, you will receive your results only by: MyChart Message (if you have MyChart) OR A paper copy in the mail If you have any lab test that is abnormal or we need to change your treatment, we will call you to review the results.   Testing/Procedures:  None Ordered   Follow-Up: At [facility], you and your health needs are our priority.  As part of our  continuing mission to provide you with exceptional heart care, we have created designated Provider Care Teams.  These Care Teams include your primary Cardiologist (physician) and Advanced Practice Providers (APPs -  Physician Assistants and Nurse Practitioners) who all work together to provide you with the care you need, when you need it.  We recommend signing up for the patient portal called "MyChart".  Sign up information is provided on this After Visit Summary.  MyChart is used to connect with patients for Virtual Visits (Telemedicine).  Patients are able to view lab/test results, encounter notes, upcoming appointments, etc.  Non-urgent messages can be sent to your provider as  well.   To learn more about what you can do with MyChart, go to [email or web address].    Your next appointment:   12 month(s)  Provider:   You may see Debbe Odea, MD or one of the following Advanced Practice Providers on your designated Care Team:   Nicolasa Ducking, NP Eula Listen, PA-C Cadence Fransico Michael, PA-C Charlsie Quest, NP    Signed, Debbe Odea, MD  08/18/2022 11:37 AM    [facility]

## 2022-08-18 NOTE — Patient Instructions (Signed)
Medication Instructions:   Your physician recommends that you continue on your current medications as directed. Please refer to the Current Medication list given to you today.  *If you need a refill on your cardiac medications before your next appointment, please call your pharmacy*   Lab Work:  Your physician recommends you go to the medical mall to have lab work completed.   If you have labs (blood work) drawn today and your tests are completely normal, you will receive your results only by: MyChart Message (if you have MyChart) OR A paper copy in the mail If you have any lab test that is abnormal or we need to change your treatment, we will call you to review the results.   Testing/Procedures:  None Ordered   Follow-Up: At [facility], you and your health needs are our priority.  As part of our continuing mission to provide you with exceptional heart care, we have created designated Provider Care Teams.  These Care Teams include your primary Cardiologist (physician) and Advanced Practice Providers (APPs -  Physician Assistants and Nurse Practitioners) who all work together to provide you with the care you need, when you need it.  We recommend signing up for the patient portal called "MyChart".  Sign up information is provided on this After Visit Summary.  MyChart is used to connect with patients for Virtual Visits (Telemedicine).  Patients are able to view lab/test results, encounter notes, upcoming appointments, etc.  Non-urgent messages can be sent to your provider as well.   To learn more about what you can do with MyChart, go to [email or web address].    Your next appointment:   12 month(s)  Provider:   You may see Debbe Odea, MD or one of the following Advanced Practice Providers on your designated Care Team:   Nicolasa Ducking, NP Eula Listen, PA-C Cadence Fransico Michael, PA-C Charlsie Quest, NP

## 2022-08-25 ENCOUNTER — Encounter: Payer: Self-pay | Admitting: Internal Medicine

## 2022-08-25 ENCOUNTER — Ambulatory Visit: Payer: Medicaid Other | Admitting: Internal Medicine

## 2022-08-25 VITALS — BP 120/86 | HR 90 | Temp 98.0°F | Ht 63.0 in | Wt 208.2 lb

## 2022-08-25 DIAGNOSIS — G4719 Other hypersomnia: Secondary | ICD-10-CM

## 2022-08-25 NOTE — Progress Notes (Signed)
Name: Nicole Rowe MRN: [id number] DOB: Dec 19, 1980    CHIEF COMPLAINT:  EXCESSIVE DAYTIME SLEEPINESS   HISTORY OF PRESENT ILLNESS: Patient is seen today for problems and issues with sleep related to excessive daytime sleepiness Patient  has been having sleep problems for many years Patient has been having excessive daytime sleepiness for a long time Patient has been having extreme fatigue and tiredness, lack of energy +  very Loud snoring every night + struggling breathe at night and gasps for air + Nonrefreshing sleep   EPWORTH SLEEP SCORE10    Non smoker Non ETOh use  Weighs 208  Referred to [place] by cardiology(assessment for arrhthymias  PAST MEDICAL HISTORY :   has a past medical history of Depression, Hypertension, Kidney stone, Migraine, and Syncope and collapse.  has a past surgical history that includes Tympanostomy tube placement and Tubal ligation. Prior to Admission medications   Medication Sig Start Date End Date Taking? Authorizing Provider  ARIPiprazole (ABILIFY) 5 MG tablet Take 5 mg by mouth at bedtime. Patient not taking: Reported on 08/18/2022 01/24/20   [provider]  BAC 50-325-40 MG tablet Take 1-2 tablets by mouth every 4 (four) hours as needed for migraine. 03/02/22   [provider]  busPIRone (BUSPAR) 10 MG tablet buspirone 10 mg tablet  TAKE 1 TABLET BY MOUTH THREE TIMES DAILY Patient not taking: Reported on 06/18/2022    [provider]  cyanocobalamin (VITAMIN B12) 1000 MCG tablet Take 1,000 mcg by mouth daily. 06/30/22   [provider]  diclofenac (VOLTAREN) 75 MG EC tablet Take 1 tablet (75 mg total) by mouth 2 (two) times daily as needed for mild pain or moderate pain. Patient not taking: Reported on 06/18/2022 08/14/20   Tommie Sams, DO  divalproex (DEPAKOTE) 250 MG DR tablet Take 250 mg by mouth 2 (two) times daily. Patient not taking: Reported on 06/18/2022    [provider]  eletriptan (RELPAX) 40  MG tablet Take 40 mg by mouth as needed for migraine or headache. May repeat in 2 hours if headache persists or recurs.    [provider]  ergocalciferol (VITAMIN D2) 1.25 MG (50000 UT) capsule Take 50,000 Units by mouth once a week. 05/24/22   [provider]  lisinopril-hydrochlorothiazide (ZESTORETIC) 20-25 MG tablet Take 1 tablet by mouth daily. 05/19/22   [provider]  mirtazapine (REMERON) 15 MG tablet Take 7.5 mg by mouth daily. Patient not taking: Reported on 08/18/2022 01/02/20   [provider]  propranolol (INDERAL) 80 MG tablet Take 1 tablet (80 mg total) by mouth 2 (two) times daily. Patient not taking: Reported on 08/18/2022 06/02/20 07/02/20  Eusebio Friendly B, PA-C   Allergies  Allergen Reactions   Amlodipine Hives and Itching   Penicillins Swelling    BRUISING AND SWELLING  I swell up and turn black/blue.  BRUISING AND SWELLING  I swell up and turn black/blue.   Other Other (See Comments)    FAMILY HISTORY:  family history includes Arrhythmia in her mother; Breast cancer in her mother; Heart attack in her father; Hyperlipidemia in her father and mother; Hypertension in her father; Stroke in her father. SOCIAL HISTORY:  reports that she has quit smoking. She has never used smokeless tobacco. She reports that she does not drink alcohol and does not use drugs.   Review of Systems:  Gen:  Denies  fever, sweats, chills weight loss  HEENT: Denies blurred vision, double vision, ear pain, eye pain, hearing  loss, nose bleeds, sore throat Cardiac:  No dizziness, chest pain or heaviness, chest tightness,edema, No JVD Resp:   No cough, -sputum production, -shortness of breath,-wheezing, -hemoptysis,  Gi: Denies swallowing difficulty, stomach pain, nausea or vomiting, diarrhea, constipation, bowel incontinence Gu:  Denies bladder incontinence, burning urine Ext:   Denies Joint pain, stiffness or swelling Skin: Denies  skin rash, easy bruising or  bleeding or hives Endoc:  Denies polyuria, polydipsia , polyphagia or weight change Psych:   Denies depression, insomnia or hallucinations  Other:  All other systems negative   ALL OTHER ROS ARE NEGATIVE  BP 120/86 (BP Location: Left Arm, Cuff Size: Large)   Pulse 90   Temp 98 F (36.7 C) (Temporal)   Ht 5\' 3"  (1.6 m)   Wt 208 lb 3.2 oz (94.4 kg)   SpO2 96%   BMI 36.88 kg/m      Physical Examination:   General Appearance: No distress  EYES PERRLA, EOM intact.   NECK Supple, No JVD Pulmonary: normal breath sounds, No wheezing.  CardiovascularNormal S1,S2.  No m/r/g.   Abdomen: Benign, Soft, non-tender. Skin:   warm, no rashes, no ecchymosis  Extremities: normal, no cyanosis, clubbing. Neuro:without focal findings,  speech normal  PSYCHIATRIC: Mood, affect within normal limits.   ALL OTHER ROS ARE NEGATIVE    ASSESSMENT AND PLAN SYNOPSIS  Patient with signs and symptoms of excessive daytime sleepiness with probable underlying diagnosis of obstructive sleep apnea in the setting of obesity and deconditioned state   Recommend Sleep Study for definitve diagnosis  Obesity -recommend significant weight loss -recommend changing diet  Deconditioned state -Recommend increased daily activity and exercise   MEDICATION ADJUSTMENTS/LABS AND TESTS ORDERED: Recommend Sleep Study Recommend weight loss   CURRENT MEDICATIONS REVIEWED AT LENGTH WITH PATIENT TODAY   Patient  satisfied with Plan of action and management. All questions answered  Follow up  3 months  Total Time Spent  35 mins   Wallis Bamberg Santiago Glad, M.D.  [facility]  Medical Director [facility] Medical Director [facility] Cardio-Pulmonary Department

## 2022-08-25 NOTE — Patient Instructions (Addendum)
Recommend Home Sleep Study Recommend Weight Loss

## 2022-09-03 ENCOUNTER — Ambulatory Visit: Payer: Medicaid Other

## 2022-09-03 DIAGNOSIS — G4733 Obstructive sleep apnea (adult) (pediatric): Secondary | ICD-10-CM | POA: Diagnosis not present

## 2022-09-03 DIAGNOSIS — G4719 Other hypersomnia: Secondary | ICD-10-CM

## 2022-09-06 DIAGNOSIS — G4733 Obstructive sleep apnea (adult) (pediatric): Secondary | ICD-10-CM | POA: Diagnosis not present

## 2022-09-16 ENCOUNTER — Telehealth: Payer: Self-pay | Admitting: Internal Medicine

## 2022-09-16 NOTE — Telephone Encounter (Signed)
Pt calling in for sleep study results, please advise

## 2022-09-16 NOTE — Telephone Encounter (Signed)
Dr. Belia Heman can you please advise on patients sleep study results?

## 2022-09-21 NOTE — Telephone Encounter (Signed)
Patient would like results of sleep study. Patient phone number is [phone].

## 2022-09-21 NOTE — Telephone Encounter (Signed)
Spoke to patient and relayed below results.  She is concerned about cpap. She is not sure that she will tolerate it. She would like to try oral device.  Dr. Belia Heman, please advise. Thanks

## 2022-09-22 ENCOUNTER — Other Ambulatory Visit: Payer: Self-pay

## 2022-09-22 DIAGNOSIS — G4733 Obstructive sleep apnea (adult) (pediatric): Secondary | ICD-10-CM

## 2022-09-22 NOTE — Telephone Encounter (Signed)
Called patient and attempted to relay below message. She requested that I call back in 1hr, as she is currently in another appt.

## 2022-09-22 NOTE — Telephone Encounter (Signed)
Patient is returning a call.  Please call patient back with the results.  CB# [phone]

## 2022-09-22 NOTE — Telephone Encounter (Signed)
Spoke with patient. Advised referral for oral appliance has been placed to Dr. Lonia Chimera office. NFN

## 2022-10-06 ENCOUNTER — Telehealth: Payer: Self-pay | Admitting: Internal Medicine

## 2022-10-06 DIAGNOSIS — G4733 Obstructive sleep apnea (adult) (pediatric): Secondary | ICD-10-CM

## 2022-10-06 NOTE — Telephone Encounter (Signed)
Pt. Need to to speak with [facility] about this please call back

## 2022-10-06 NOTE — Telephone Encounter (Signed)
Pt states the dentist we referred her to for oral appliance does not accept her insurance. Is asking for a referral to one who does.

## 2022-10-06 NOTE — Telephone Encounter (Signed)
Anita, please advise. Thanks 

## 2022-10-26 NOTE — Addendum Note (Signed)
Addended by: Bonney Leitz on: 10/26/2022 10:35 AM   Modules accepted: Orders

## 2022-10-26 NOTE — Telephone Encounter (Signed)
We have been getting no where with getting the Oral Device for sleep apnea. When I spoke with Nicole Rowe this morning she stated to go ahead and place the order for new cpap setup. Her husband is getting worried because she is still having issues

## 2022-10-26 NOTE — Telephone Encounter (Signed)
Pt calling back about oral appliance

## 2022-10-26 NOTE — Telephone Encounter (Signed)
Per telephone encounter from 09/16/2022, Dr. Belia Heman recommends AUTO CPAP 5-12 cm h20.   I have placed the order for the CPAP and notified the patient.  Nothing further needed.

## 2022-11-18 ENCOUNTER — Ambulatory Visit: Payer: Medicaid Other | Admitting: Internal Medicine

## 2022-12-16 ENCOUNTER — Encounter: Payer: Self-pay | Admitting: Internal Medicine

## 2022-12-16 ENCOUNTER — Telehealth (INDEPENDENT_AMBULATORY_CARE_PROVIDER_SITE_OTHER): Payer: Medicaid Other | Admitting: Internal Medicine

## 2022-12-16 DIAGNOSIS — R5381 Other malaise: Secondary | ICD-10-CM | POA: Diagnosis not present

## 2022-12-16 DIAGNOSIS — E669 Obesity, unspecified: Secondary | ICD-10-CM

## 2022-12-16 DIAGNOSIS — G4733 Obstructive sleep apnea (adult) (pediatric): Secondary | ICD-10-CM

## 2022-12-16 NOTE — Patient Instructions (Signed)
Continue CPAP as prescribed  Recommend weight loss  Increased exercise tolerance

## 2022-12-16 NOTE — Progress Notes (Signed)
Name: Nicole Rowe MRN: [id number] DOB: 1980/06/25      I connected with the patient by VIDEO telemedicine visit and verified that I am speaking with the correct person using two identifiers.    I discussed the limitations, risks, security and privacy concerns of performing an evaluation and management service by telemedicine and the availability of in-person appointments. I also discussed with the patient that there may be a patient responsible charge related to this service. The patient expressed understanding and agreed to proceed.  PATIENT AGREES AND CONFIRMS -YES   Other persons participating in the visit and their role in the encounter: Patient, nursing  Patient at home Physician in office     CHIEF COMPLAINT:  Follow-up assessment for OSA   HISTORY OF PRESENT ILLNESS: Follow-up assessment for OSA HST obtained showing a AHI of 14 Sleep study reviewed in detail  Non smoker Non ETOh use  Weighs 208  Referred to [place] by cardiology(assessment for arrhythmias)  PAST MEDICAL HISTORY :   has a past medical history of Depression, Hypertension, Kidney stone, Migraine, and Syncope and collapse.  has a past surgical history that includes Tympanostomy tube placement and Tubal ligation. Prior to Admission medications   Medication Sig Start Date End Date Taking? Authorizing Provider  ARIPiprazole (ABILIFY) 5 MG tablet Take 5 mg by mouth at bedtime. Patient not taking: Reported on 08/18/2022 01/24/20   [provider]  BAC 50-325-40 MG tablet Take 1-2 tablets by mouth every 4 (four) hours as needed for migraine. 03/02/22   [provider]  busPIRone (BUSPAR) 10 MG tablet buspirone 10 mg tablet  TAKE 1 TABLET BY MOUTH THREE TIMES DAILY Patient not taking: Reported on 06/18/2022    [provider]  cyanocobalamin (VITAMIN B12) 1000 MCG tablet Take 1,000 mcg by mouth daily. 06/30/22   [provider]  diclofenac (VOLTAREN) 75 MG EC tablet Take 1  tablet (75 mg total) by mouth 2 (two) times daily as needed for mild pain or moderate pain. Patient not taking: Reported on 06/18/2022 08/14/20   Tommie Sams, DO  divalproex (DEPAKOTE) 250 MG DR tablet Take 250 mg by mouth 2 (two) times daily. Patient not taking: Reported on 06/18/2022    [provider]  eletriptan (RELPAX) 40 MG tablet Take 40 mg by mouth as needed for migraine or headache. May repeat in 2 hours if headache persists or recurs.    [provider]  ergocalciferol (VITAMIN D2) 1.25 MG (50000 UT) capsule Take 50,000 Units by mouth once a week. 05/24/22   [provider]  lisinopril-hydrochlorothiazide (ZESTORETIC) 20-25 MG tablet Take 1 tablet by mouth daily. 05/19/22   [provider]  mirtazapine (REMERON) 15 MG tablet Take 7.5 mg by mouth daily. Patient not taking: Reported on 08/18/2022 01/02/20   [provider]  propranolol (INDERAL) 80 MG tablet Take 1 tablet (80 mg total) by mouth 2 (two) times daily. Patient not taking: Reported on 08/18/2022 06/02/20 07/02/20  Eusebio Friendly B, PA-C   Allergies  Allergen Reactions   Amlodipine Hives and Itching   Penicillins Swelling    BRUISING AND SWELLING  I swell up and turn black/blue.  BRUISING AND SWELLING  I swell up and turn black/blue.   Other Other (See Comments)    FAMILY HISTORY:  family history includes Arrhythmia in her mother; Breast cancer in her mother; Heart attack in her father; Hyperlipidemia in her father and mother; Hypertension in her father; Stroke in her father. SOCIAL  HISTORY:  reports that she quit smoking about 5 years ago. Her smoking use included cigarettes. She has never used smokeless tobacco. She reports that she does not drink alcohol and does not use drugs.   Review of Systems:  Gen:  Denies  fever, sweats, chills weight loss  HEENT: Denies blurred vision, double vision, ear pain, eye pain, hearing loss, nose bleeds, sore throat Cardiac:  No dizziness, chest  pain or heaviness, chest tightness,edema, No JVD Resp:   No cough, -sputum production, -shortness of breath,-wheezing, -hemoptysis,  Gi: Denies swallowing difficulty, stomach pain, nausea or vomiting, diarrhea, constipation, bowel incontinence Gu:  Denies bladder incontinence, burning urine Ext:   Denies Joint pain, stiffness or swelling Skin: Denies  skin rash, easy bruising or bleeding or hives Endoc:  Denies polyuria, polydipsia , polyphagia or weight change Psych:   Denies depression, insomnia or hallucinations  Other:  All other systems negative   ASSESSMENT AND PLAN SYNOPSIS  Patient with signs and symptoms of excessive daytime sleepiness with probable underlying diagnosis of obstructive sleep apnea in the setting of obesity and deconditioned state   Recommend Sleep Study for definitve diagnosis  Obesity -recommend significant weight loss -recommend changing diet  Deconditioned state -Recommend increased daily activity and exercise   MEDICATION ADJUSTMENTS/LABS AND TESTS ORDERED:  CURRENT MEDICATIONS REVIEWED AT LENGTH WITH PATIENT TODAY   Patient  satisfied with Plan of action and management. All questions answered  Follow-up in 6 months  Total Time Spent  22 mins   Leauna Sharber Santiago Glad, M.D.  [facility]  Medical Director [facility] Medical Director [facility] Cardio-Pulmonary Department

## 2023-08-12 ENCOUNTER — Telehealth: Payer: Self-pay | Admitting: Internal Medicine

## 2023-08-12 NOTE — Telephone Encounter (Signed)
 Patient was last seen by Dr. Auston Left with video visit on 12/16/22. Her insurance was asking for updated notes within the past six months confirming compliance with her Cpap. I spoke with Nicole Rowe and she is using her Cpap and she has been making all of her appts virtual do to car sickness. Patient will need to have video visit proving compliance if not insurance will take her Cpap from her

## 2023-11-17 NOTE — Telephone Encounter (Signed)
 LVM to schedule virtual with Cobb or Kasa

## 2023-11-18 ENCOUNTER — Telehealth: Admitting: Nurse Practitioner

## 2023-11-18 ENCOUNTER — Ambulatory Visit: Admitting: Nurse Practitioner

## 2023-11-18 DIAGNOSIS — E6609 Other obesity due to excess calories: Secondary | ICD-10-CM | POA: Diagnosis not present

## 2023-11-18 DIAGNOSIS — G4733 Obstructive sleep apnea (adult) (pediatric): Secondary | ICD-10-CM | POA: Diagnosis not present

## 2023-11-18 NOTE — Patient Instructions (Signed)
 You have mild sleep apnea. You've had difficulties tolerating CPAP, despite mask changes. We discussed revisiting use of an oral appliance. Reach out to the places I gave you and see if they take Medicaid. That will be the biggest barrier to obtaining one. Keep us  posted on what you find out so we can send a referral  Use caution when driving and pull over if you become sleepy   Follow up in 6 months with [name] or Izetta Malachy Rowe. If symptoms do not improve or worsen, please contact office for sooner follow up
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
## 2023-11-21 ENCOUNTER — Encounter: Payer: Self-pay | Admitting: Nurse Practitioner

## 2023-11-21 DIAGNOSIS — G4733 Obstructive sleep apnea (adult) (pediatric): Secondary | ICD-10-CM | POA: Insufficient documentation

## 2023-11-21 DIAGNOSIS — E669 Obesity, unspecified: Secondary | ICD-10-CM | POA: Insufficient documentation

## 2023-11-21 NOTE — Assessment & Plan Note (Signed)
 Mild OSA. Intolerant of CPAP. Minimal cardiovascular risks associated with mild OSA. Reviewed potential alternative treatment options. Will attempt to find a provider who takes Medicaid for oral appliance. Provided pt with a few numbers to contact, including Dr. Micky and Dr. Melba. Advised healthy weight loss to help reduce severity/resolve OSA. Safe driving practices reviewed.  Patient Instructions  You have mild sleep apnea. You've had difficulties tolerating CPAP, despite mask changes. We discussed revisiting use of an oral appliance. Reach out to the places I gave you and see if they take Medicaid. That will be the biggest barrier to obtaining one. Keep us  posted on what you find out so we can send a referral  Use caution when driving and pull over if you become sleepy   Follow up in 6 months with [name] or [name]. If symptoms do not improve or worsen, please contact office for sooner follow up

## 2023-11-21 NOTE — Assessment & Plan Note (Signed)
 Healthy weight loss encouraged
